# Patient Record
Sex: Male | Born: 1965 | Race: White | Hispanic: No | Marital: Single | Smoking: Current every day smoker
Health system: Southern US, Community
[De-identification: ages and names within clinical notes are randomized; demographics above are authoritative.]

## PROBLEM LIST (undated history)

## (undated) DIAGNOSIS — F419 Anxiety disorder, unspecified: Secondary | ICD-10-CM

## (undated) DIAGNOSIS — D369 Benign neoplasm, unspecified site: Secondary | ICD-10-CM

## (undated) DIAGNOSIS — I10 Essential (primary) hypertension: Secondary | ICD-10-CM

## (undated) DIAGNOSIS — K859 Acute pancreatitis without necrosis or infection, unspecified: Secondary | ICD-10-CM

## (undated) DIAGNOSIS — K635 Polyp of colon: Secondary | ICD-10-CM

## (undated) DIAGNOSIS — E785 Hyperlipidemia, unspecified: Secondary | ICD-10-CM

## (undated) DIAGNOSIS — K5790 Diverticulosis of intestine, part unspecified, without perforation or abscess without bleeding: Secondary | ICD-10-CM

## (undated) HISTORY — PX: KNEE SURGERY: SHX244

## (undated) HISTORY — DX: Diverticulosis of intestine, part unspecified, without perforation or abscess without bleeding: K57.90

## (undated) HISTORY — DX: Anxiety disorder, unspecified: F41.9

## (undated) HISTORY — PX: FEMORAL HERNIA REPAIR: SHX632

## (undated) HISTORY — DX: Benign neoplasm, unspecified site: D36.9

## (undated) HISTORY — DX: Polyp of colon: K63.5

## (undated) HISTORY — DX: Acute pancreatitis without necrosis or infection, unspecified: K85.90

## (undated) HISTORY — DX: Hyperlipidemia, unspecified: E78.5

---

## 2002-06-21 ENCOUNTER — Ambulatory Visit (HOSPITAL_COMMUNITY): Admission: RE | Admit: 2002-06-21 | Discharge: 2002-06-21 | Payer: Self-pay | Admitting: Family Medicine

## 2002-06-21 ENCOUNTER — Encounter: Payer: Self-pay | Admitting: Family Medicine

## 2004-04-21 ENCOUNTER — Ambulatory Visit: Payer: Self-pay | Admitting: Gastroenterology

## 2004-05-01 ENCOUNTER — Ambulatory Visit (HOSPITAL_COMMUNITY): Admission: RE | Admit: 2004-05-01 | Discharge: 2004-05-01 | Payer: Self-pay | Admitting: Gastroenterology

## 2004-05-01 ENCOUNTER — Ambulatory Visit: Payer: Self-pay | Admitting: Gastroenterology

## 2004-05-05 ENCOUNTER — Ambulatory Visit: Payer: Self-pay | Admitting: Gastroenterology

## 2004-05-11 ENCOUNTER — Ambulatory Visit: Payer: Self-pay | Admitting: Gastroenterology

## 2004-05-13 ENCOUNTER — Ambulatory Visit: Payer: Self-pay | Admitting: Gastroenterology

## 2004-05-25 ENCOUNTER — Encounter: Admission: RE | Admit: 2004-05-25 | Discharge: 2004-08-23 | Payer: Self-pay | Admitting: Gastroenterology

## 2004-05-29 ENCOUNTER — Ambulatory Visit: Payer: Self-pay | Admitting: Gastroenterology

## 2004-06-02 ENCOUNTER — Ambulatory Visit: Payer: Self-pay | Admitting: Gastroenterology

## 2004-07-31 ENCOUNTER — Ambulatory Visit: Payer: Self-pay | Admitting: Gastroenterology

## 2004-08-06 ENCOUNTER — Ambulatory Visit: Payer: Self-pay | Admitting: Cardiology

## 2004-09-24 ENCOUNTER — Ambulatory Visit: Payer: Self-pay | Admitting: Internal Medicine

## 2004-11-19 ENCOUNTER — Ambulatory Visit: Payer: Self-pay | Admitting: Cardiology

## 2004-12-24 ENCOUNTER — Ambulatory Visit: Payer: Self-pay | Admitting: Cardiology

## 2005-04-22 ENCOUNTER — Ambulatory Visit: Payer: Self-pay | Admitting: Cardiology

## 2005-11-11 ENCOUNTER — Ambulatory Visit: Payer: Self-pay | Admitting: Cardiology

## 2005-12-22 ENCOUNTER — Ambulatory Visit (HOSPITAL_COMMUNITY): Admission: RE | Admit: 2005-12-22 | Discharge: 2005-12-22 | Payer: Self-pay | Admitting: Orthopaedic Surgery

## 2006-02-03 ENCOUNTER — Ambulatory Visit (HOSPITAL_BASED_OUTPATIENT_CLINIC_OR_DEPARTMENT_OTHER): Admission: RE | Admit: 2006-02-03 | Discharge: 2006-02-03 | Payer: Self-pay | Admitting: Orthopedic Surgery

## 2007-11-26 ENCOUNTER — Emergency Department (HOSPITAL_COMMUNITY): Admission: EM | Admit: 2007-11-26 | Discharge: 2007-11-26 | Payer: Self-pay | Admitting: Emergency Medicine

## 2010-07-17 NOTE — Op Note (Signed)
NAME:  Daniel Mcpherson, Daniel Mcpherson                  ACCOUNT NO.:  [id number]   MEDICAL RECORD NO.:  [id number]          PATIENT TYPE:  AMB   LOCATION:  DSC                          FACILITY:  [facility]   PHYSICIAN:  Rodney A. Mortenson, M.D.DATE OF BIRTH:  June 11, 1965   DATE OF PROCEDURE:  02/03/2006  DATE OF DISCHARGE:                               OPERATIVE REPORT   JUSTIFICATION:  A 45 year old male having trouble with his left knee for  over a year.  No history of injury.  Going up and down stairs causes  pain and discomfort.  He has pain along the medial joint line.  There is  acute tenderness along medial joint line, along the mid third and  posterior third of the medial joint line.  No fluid in the knee.  An MRI  was done and shows an oblique tear of the posterior horn of the medial  meniscus and a small medial meniscal cyst.  No other significant  abnormality is noted.  Because of persistent pain and discomfort, it is  felt that arthroscopic evaluation and treatment was indicated and  necessary.  Questions answered and encouraged extensively  preoperatively.  Complications discussed.   JUSTIFICATION FOR OUTPATIENT SURGERY:  Minimal morbidity.   PREOPERATIVE DIAGNOSIS:  Tear, medial meniscus, left knee.   POSTOPERATIVE DIAGNOSIS:  Tear, medial meniscus, left knee.   OPERATION:  Debride posterior horn of the medial meniscus, left knee.   SURGEON:  Lenard Galloway. Chaney Malling, M.D.   ANESTHESIA:  MAC.   PATHOLOGY:  With the arthroscope in the knee, a very careful examination  of the knee was undertaken.  The patellofemoral joint appeared fairly  normal.  Good articular cartilage.  The arthroscope was placed into the  intercondylar notch.  The anterior cruciate was absolutely normal.  In  the lateral compartment there was normal articular cartilage.  The  lateral femoral condyle, the lateral tibial plateau, and the entire  circumference of the lateral meniscus were normal.  In the medial  compartment  there was good articular cartilage over the distal femur and  proximal tibial plateau with no damage, but there was a complex tear of  the posterior horn of the medial meniscus.   PROCEDURE:  The patient was placed on the operating table in the supine  position with a pneumatic tourniquet about the left upper thigh.  Th  left leg was placed in the leg holder and the entire left lower  extremity prepped with DuraPrep and draped out in the usual manner.  An  infusion cannula was placed in the superomedial pouch and the knee  distended with saline.  Anteromedial and anterolateral portals made and  the arthroscope was introduced.  The only pathology was seen in the  medial compartment.  Through both medial lateral portals a series of  baskets were inserted and the complex tear of the posterior horn was  debrided very aggressively.  This was followed up with the intra-  articular shaver.  All debris was removed.  The remaining rim was then  smoothed and balanced with nice transition to the mid third  of the  medial meniscus.  The rest of the knee appeared absolutely normal.  Marcaine was placed in the knee and a large bulky pressure dressing  applied.  The patient returned to the recovery room in the excellent  condition.  I was a very pleased with surgical outcome.   DISPOSITION:  1. Percocet for pain.  2. To my office on Wednesday of next week.  3. Other home instructions given verbally to the patient.           ______________________________  Lenard Galloway. Chaney Malling, M.D.     RAM/MEDQ  D:  02/03/2006  T:  02/03/2006  Job:  [id number]

## 2012-03-13 ENCOUNTER — Other Ambulatory Visit (HOSPITAL_COMMUNITY): Payer: Self-pay | Admitting: Physical Medicine and Rehabilitation

## 2012-03-13 DIAGNOSIS — M542 Cervicalgia: Secondary | ICD-10-CM

## 2012-03-13 DIAGNOSIS — M5020 Other cervical disc displacement, unspecified cervical region: Secondary | ICD-10-CM

## 2012-03-15 ENCOUNTER — Ambulatory Visit (HOSPITAL_COMMUNITY)
Admission: RE | Admit: 2012-03-15 | Discharge: 2012-03-15 | Disposition: A | Discharge status: Home / Self Care | Payer: 59 | Source: Ambulatory Visit | Attending: Physical Medicine and Rehabilitation | Admitting: Physical Medicine and Rehabilitation

## 2012-03-15 DIAGNOSIS — M542 Cervicalgia: Secondary | ICD-10-CM

## 2012-03-15 DIAGNOSIS — R209 Unspecified disturbances of skin sensation: Secondary | ICD-10-CM | POA: Insufficient documentation

## 2012-03-15 DIAGNOSIS — M5020 Other cervical disc displacement, unspecified cervical region: Secondary | ICD-10-CM | POA: Insufficient documentation

## 2012-11-25 ENCOUNTER — Encounter (HOSPITAL_COMMUNITY): Payer: Self-pay | Admitting: *Deleted

## 2012-11-25 ENCOUNTER — Inpatient Hospital Stay (HOSPITAL_COMMUNITY)
Admission: EM | Admit: 2012-11-25 | Discharge: 2012-11-28 | DRG: 088 | Disposition: A | Discharge status: Home / Self Care | Payer: 59 | Attending: General Surgery | Admitting: General Surgery

## 2012-11-25 ENCOUNTER — Emergency Department (HOSPITAL_COMMUNITY): Payer: 59

## 2012-11-25 DIAGNOSIS — S060X1A Concussion with loss of consciousness of 30 minutes or less, initial encounter: Principal | ICD-10-CM | POA: Diagnosis present

## 2012-11-25 DIAGNOSIS — W1789XA Other fall from one level to another, initial encounter: Secondary | ICD-10-CM | POA: Diagnosis present

## 2012-11-25 DIAGNOSIS — Y93H2 Activity, gardening and landscaping: Secondary | ICD-10-CM

## 2012-11-25 DIAGNOSIS — S42109A Fracture of unspecified part of scapula, unspecified shoulder, initial encounter for closed fracture: Secondary | ICD-10-CM

## 2012-11-25 DIAGNOSIS — S22009A Unspecified fracture of unspecified thoracic vertebra, initial encounter for closed fracture: Secondary | ICD-10-CM

## 2012-11-25 DIAGNOSIS — I10 Essential (primary) hypertension: Secondary | ICD-10-CM | POA: Diagnosis present

## 2012-11-25 DIAGNOSIS — S2242XA Multiple fractures of ribs, left side, initial encounter for closed fracture: Secondary | ICD-10-CM

## 2012-11-25 DIAGNOSIS — W11XXXA Fall on and from ladder, initial encounter: Secondary | ICD-10-CM | POA: Diagnosis present

## 2012-11-25 DIAGNOSIS — S2249XA Multiple fractures of ribs, unspecified side, initial encounter for closed fracture: Secondary | ICD-10-CM | POA: Diagnosis present

## 2012-11-25 DIAGNOSIS — W19XXXA Unspecified fall, initial encounter: Secondary | ICD-10-CM

## 2012-11-25 DIAGNOSIS — S270XXA Traumatic pneumothorax, initial encounter: Secondary | ICD-10-CM

## 2012-11-25 DIAGNOSIS — S42102A Fracture of unspecified part of scapula, left shoulder, initial encounter for closed fracture: Secondary | ICD-10-CM

## 2012-11-25 DIAGNOSIS — F3289 Other specified depressive episodes: Secondary | ICD-10-CM | POA: Diagnosis present

## 2012-11-25 DIAGNOSIS — F17200 Nicotine dependence, unspecified, uncomplicated: Secondary | ICD-10-CM | POA: Diagnosis present

## 2012-11-25 DIAGNOSIS — S060X9A Concussion with loss of consciousness of unspecified duration, initial encounter: Secondary | ICD-10-CM

## 2012-11-25 DIAGNOSIS — S42143A Displaced fracture of glenoid cavity of scapula, unspecified shoulder, initial encounter for closed fracture: Secondary | ICD-10-CM | POA: Diagnosis present

## 2012-11-25 DIAGNOSIS — Z7982 Long term (current) use of aspirin: Secondary | ICD-10-CM

## 2012-11-25 DIAGNOSIS — F329 Major depressive disorder, single episode, unspecified: Secondary | ICD-10-CM | POA: Diagnosis present

## 2012-11-25 DIAGNOSIS — S271XXA Traumatic hemothorax, initial encounter: Secondary | ICD-10-CM | POA: Diagnosis present

## 2012-11-25 HISTORY — DX: Essential (primary) hypertension: I10

## 2012-11-25 LAB — POCT I-STAT, CHEM 8
Calcium, Ion: 1.13 mmol/L (ref 1.12–1.23)
Glucose, Bld: 131 mg/dL — ABNORMAL HIGH (ref 70–99)
HCT: 53 % — ABNORMAL HIGH (ref 39.0–52.0)
Hemoglobin: 18 g/dL — ABNORMAL HIGH (ref 13.0–17.0)

## 2012-11-25 LAB — COMPREHENSIVE METABOLIC PANEL
Albumin: 4.2 g/dL (ref 3.5–5.2)
BUN: 22 mg/dL (ref 6–23)
Calcium: 9.4 mg/dL (ref 8.4–10.5)
Chloride: 98 mEq/L (ref 96–112)
Creatinine, Ser: 0.95 mg/dL (ref 0.50–1.35)
Total Bilirubin: 0.4 mg/dL (ref 0.3–1.2)

## 2012-11-25 LAB — CBC
Hemoglobin: 17.6 g/dL — ABNORMAL HIGH (ref 13.0–17.0)
MCV: 88.1 fL (ref 78.0–100.0)
Platelets: 206 10*3/uL (ref 150–400)
RBC: 5.55 MIL/uL (ref 4.22–5.81)
WBC: 23.5 10*3/uL — ABNORMAL HIGH (ref 4.0–10.5)

## 2012-11-25 LAB — SAMPLE TO BLOOD BANK

## 2012-11-25 LAB — PROTIME-INR: Prothrombin Time: 11.8 seconds (ref 11.6–15.2)

## 2012-11-25 MED ORDER — PANTOPRAZOLE SODIUM 40 MG PO TBEC: 40.0000 mg | DELAYED_RELEASE_TABLET | Freq: Every day | ORAL | Status: DC

## 2012-11-25 MED ORDER — MORPHINE SULFATE 4 MG/ML IJ SOLN
4.0000 mg | INTRAMUSCULAR | Status: DC | PRN
Start: 2012-11-25 — End: 2012-11-26
  Administered 2012-11-26 (×2): 4 mg via INTRAVENOUS
  Filled 2012-11-25 (×3): qty 1

## 2012-11-25 MED ORDER — HYDROMORPHONE HCL PF 1 MG/ML IJ SOLN
1.0000 mg | Freq: Once | INTRAMUSCULAR | Status: AC
  Administered 2012-11-25: 1 mg via INTRAVENOUS
  Filled 2012-11-25: qty 1

## 2012-11-25 MED ORDER — ONDANSETRON HCL 4 MG/2ML IJ SOLN: 4.0000 mg | Freq: Four times a day (QID) | INTRAMUSCULAR | Status: DC | PRN

## 2012-11-25 MED ORDER — MORPHINE SULFATE 4 MG/ML IJ SOLN
INTRAMUSCULAR | Status: AC
  Administered 2012-11-25: 4 mg
  Filled 2012-11-25: qty 1

## 2012-11-25 MED ORDER — SODIUM CHLORIDE 0.9 % IV BOLUS (SEPSIS)
500.0000 mL | Freq: Once | INTRAVENOUS | Status: AC
  Administered 2012-11-25: 500 mL via INTRAVENOUS

## 2012-11-25 MED ORDER — KCL IN DEXTROSE-NACL 20-5-0.9 MEQ/L-%-% IV SOLN
INTRAVENOUS | Status: DC
  Administered 2012-11-25: 100 mL/h via INTRAVENOUS
  Administered 2012-11-26 – 2012-11-27 (×2): via INTRAVENOUS
  Filled 2012-11-25 (×5): qty 1000

## 2012-11-25 MED ORDER — ONDANSETRON HCL 4 MG PO TABS: 4.0000 mg | ORAL_TABLET | Freq: Four times a day (QID) | ORAL | Status: DC | PRN

## 2012-11-25 MED ORDER — PANTOPRAZOLE SODIUM 40 MG IV SOLR
40.0000 mg | Freq: Every day | INTRAVENOUS | Status: DC
  Filled 2012-11-25: qty 40

## 2012-11-25 MED ORDER — HYDROCHLOROTHIAZIDE 25 MG PO TABS
25.0000 mg | ORAL_TABLET | Freq: Every day | ORAL | Status: DC
  Administered 2012-11-26 – 2012-11-28 (×3): 25 mg via ORAL
  Filled 2012-11-25 (×4): qty 1

## 2012-11-25 MED ORDER — ESCITALOPRAM OXALATE 10 MG PO TABS
10.0000 mg | ORAL_TABLET | Freq: Every day | ORAL | Status: DC
  Administered 2012-11-26 – 2012-11-28 (×3): 10 mg via ORAL
  Filled 2012-11-25 (×4): qty 1

## 2012-11-25 MED ORDER — IOHEXOL 300 MG/ML  SOLN
80.0000 mL | Freq: Once | INTRAMUSCULAR | Status: AC | PRN
  Administered 2012-11-25: 80 mL via INTRAVENOUS

## 2012-11-25 NOTE — ED Notes (Signed)
Former girlfriend  At the bedside.  The pt reports that he does not remember  The accident.  He has a bulging disc in his neck

## 2012-11-25 NOTE — ED Notes (Signed)
Pt to ct 

## 2012-11-25 NOTE — ED Notes (Signed)
The pt  Returned from xray.  Father still at the bedside

## 2012-11-25 NOTE — ED Notes (Signed)
In by [facility] fall from a tree cutting limbc   approx 15 feet.  ?? Loc  Alert on arrival.  Painful  Lt shoulder

## 2012-11-25 NOTE — ED Notes (Signed)
The pt reports that his pain in his lt shoulder is worse since the  c-t

## 2012-11-25 NOTE — ED Notes (Signed)
Report called to 3100 rn 

## 2012-11-25 NOTE — ED Notes (Signed)
Iv pain med given for his lt shoulder and chest pain

## 2012-11-25 NOTE — ED Notes (Signed)
Dr toth here to see the pt

## 2012-11-25 NOTE — ED Notes (Signed)
Dr Radford Pax has  Agreed to let him have ice ships

## 2012-11-25 NOTE — Progress Notes (Signed)
Orthopedic Tech Progress Note Patient Details:  Daniel Mcpherson 1965-11-24 [id number]  Patient ID: Daniel Mcpherson, male   DOB: 1965-10-20, 47 y.o.   MRN: [id number] Made level 2 trauma visit  Daniel Mcpherson 11/25/2012, 5:55 PM

## 2012-11-25 NOTE — ED Notes (Signed)
Xray back for a portable shoulder.  His pain was better.  His father is here with his sister

## 2012-11-25 NOTE — ED Notes (Signed)
No fluids hanging on arrival from ems

## 2012-11-25 NOTE — ED Notes (Signed)
Pain med given .  The pt keeps requesting to turn  Over he is very uncomfortable

## 2012-11-25 NOTE — ED Provider Notes (Signed)
CSN: [id number]     Arrival date & time 11/25/12  1743 History   First MD Initiated Contact with Patient 11/25/12 1751     Chief Complaint  Patient presents with  . fall level 2    (Consider location/radiation/quality/duration/timing/severity/associated sxs/prior Treatment) HPI Comments: 47 y/o male presenting as level 2 trauma after 18 ft fall from tree while cutting limbs. + LOC. Alert on arrival of EMS, GCS 13. Hemodynamically stable. Reports left shoulder and chest pain. Mild SOB but not hypoxic.   The history is provided by the patient and the EMS personnel. No language interpreter was used.    Past Medical History  Diagnosis Date  . Hypertension    History reviewed. No pertinent past surgical history. No family history on file. History  Substance Use Topics  . Smoking status: Current Every Day Smoker  . Smokeless tobacco: Not on file  . Alcohol Use: Yes    Review of Systems  Respiratory: Positive for chest tightness and shortness of breath.   Cardiovascular: Positive for chest pain. Negative for palpitations.  Gastrointestinal: Negative for abdominal pain and abdominal distention.  Genitourinary: Negative for flank pain.  Musculoskeletal: Positive for back pain. Negative for joint swelling.  Skin: Positive for wound.  Neurological: Negative for headaches.  All other systems reviewed and are negative.    Allergies  Review of patient's allergies indicates no known allergies.  Home Medications   Current Outpatient Rx  Name  Route  Sig  Dispense  Refill  . aspirin EC 81 MG tablet   Oral   Take 81 mg by mouth daily.         Marland Kitchen escitalopram (LEXAPRO) 10 MG tablet   Oral   Take 10 mg by mouth daily.         . hydrochlorothiazide (HYDRODIURIL) 25 MG tablet   Oral   Take 25 mg by mouth daily.         . Omega-3 Fatty Acids (OMEGA 3 PO)   Oral   Take 3,000 mg by mouth daily.          BP 120/90  Pulse 102  Temp(Src) 99.3 F (37.4 C)  Resp 20  Ht 5'  6" (1.676 m)  Wt 160 lb (72.576 kg)  BMI 25.84 kg/m2  SpO2 96% Physical Exam  Vitals reviewed. Constitutional: He is oriented to person, place, and time. He appears well-developed and well-nourished. No distress.  HENT:  Mouth/Throat: Oropharynx is clear and moist.  Abrasion to left parietal region, hemostatic  Eyes: Pupils are equal, round, and reactive to light.  Neck: No tracheal deviation present.  Cervical collar  Cardiovascular: Regular rhythm, normal heart sounds and intact distal pulses.  Tachycardia present.   Pulmonary/Chest: Effort normal and breath sounds normal. He exhibits tenderness (left, no crepitus).  Abdominal: Soft. Bowel sounds are normal. He exhibits no distension. There is no tenderness.  Musculoskeletal:       Cervical back: He exhibits no bony tenderness and no deformity.       Thoracic back: He exhibits no bony tenderness and no deformity.       Lumbar back: He exhibits no bony tenderness and no deformity.  Moving all extremities, no obvious bony deformity. Pelvis stable to compression  Neurological: He is alert and oriented to person, place, and time.  Skin: Skin is warm and dry.       ED Course  Procedures (including critical care time) Labs Review Labs Reviewed  COMPREHENSIVE METABOLIC PANEL - Abnormal; Notable  for the following:    Potassium 3.4 (*)    Glucose, Bld 127 (*)    All other components within normal limits  CBC - Abnormal; Notable for the following:    WBC 23.5 (*)    Hemoglobin 17.6 (*)    All other components within normal limits  CG4 I-STAT (LACTIC ACID) - Abnormal; Notable for the following:    Lactic Acid, Venous 2.22 (*)    All other components within normal limits  POCT I-STAT, CHEM 8 - Abnormal; Notable for the following:    Potassium 3.3 (*)    BUN 25 (*)    Glucose, Bld 131 (*)    Hemoglobin 18.0 (*)    HCT 53.0 (*)    All other components within normal limits  CDS SEROLOGY  PROTIME-INR  SAMPLE TO BLOOD BANK    Imaging Review Dg Pelvis Portable  11/25/2012   CLINICAL DATA:  Prominent, fell 15 feet from a tree  EXAM: PORTABLE PELVIS  COMPARISON:  Portable exam 1750 hr without priors for comparison.  FINDINGS: Symmetric hip and SI joints.  Osseous mineralization normal.  No acute fracture, dislocation or bone destruction.  IMPRESSION: No acute abnormalities.   Electronically Signed   By: Ulyses Southward M.D.   On: 11/25/2012 18:09   Dg Chest Port 1 View  11/25/2012   CLINICAL DATA:  Trauma, fell 18 feet from a tree  EXAM: PORTABLE CHEST - 1 VIEW  COMPARISON:  Portable exam 1749 hr without priors for comparison.  FINDINGS: Normal heart size, mediastinal contours, and pulmonary vascularity.  Lungs clear.  No pleural effusion or pneumothorax.  Minimally displaced fractures are identified at the lateral aspects of the left 3rd 4th 5th and 6th ribs. Question additional nondisplaced fracture of left 7th rib. Left shoulder joint is inadequately visualized at the margin of the film, partially obscured by text, with poor visualization of the left glenoid noted.  IMPRESSION: Fractures of left 3rd, 4th, 5th, 6th and questionably 7th ribs. Left glenoid is poorly visualized, partially obscured by text ; if there is clinical concern for left shoulder injury, recommend dedicated radiographs.   Electronically Signed   By: Ulyses Southward M.D.   On: 11/25/2012 18:08    Date: 11/25/2012  Rate: 100  Rhythm: sinus tachycardia  QRS Axis: normal  Intervals: normal  ST/T Wave abnormalities: normal  Conduction Disutrbances:right bundle branch block  Narrative Interpretation: sinus tachycardia, no ischemic changes  Old EKG Reviewed: none available   MDM  No diagnosis found.  47 y/o male Level 2 trauma after fall 18 ft from tree. + LOC. Hemodynamically stable on arrival. ABCs intact. Reporting pain over left scapula with mild SOB. CXR showed left rib fractures. Patient hemodynamically stable and appropriate for CT. Injuries  identified on CT: L 3-9 rib fractures, small <10% pneumothorax, L T1-T6 transverse process fractures. Trauma consulted for admission. Orthopedics consulted with recommendation for sling.   Labs and imaging reviewed in my medical decision making. Patient discussed with my attending, Dr. Radford Pax.     Abagail Kitchens, MD 11/26/12 1227

## 2012-11-25 NOTE — ED Notes (Signed)
The pt arrived on lsb c-collar.  He fell approx 15 feet from a tree while cutting tree limbs.  ?? Loc lac to the back of his head. Painful lt shoulder abrasion to the rt wrist.  C/o lt chest pain.  Bi-lateral breath sounds.  Iv per [facility].  Pt alert not remembering the accident.  Pupils  Equal and react.

## 2012-11-25 NOTE — ED Notes (Signed)
The family remains ar the bedside.  According to the pts father dr toth will not let the pt have any more ice

## 2012-11-25 NOTE — ED Notes (Signed)
The pt has been cool intermittently warm blankets placed

## 2012-11-25 NOTE — H&P (Signed)
Daniel Mcpherson is an 47 y.o. male.   Chief Complaint: left shoulder pain HPI: The pt is a 47yo wm who was on a ladder sawing a limb with a chain saw. The limb swung back at him and knocked him off the ladder. He fell about 42ft onto his left side. He did lose consciousness. No hypotension. He complains of left shoulder pain  Past Medical History  Diagnosis Date  . Hypertension     History reviewed. No pertinent past surgical history.  No family history on file. Social History:  reports that he has been smoking.  He does not have any smokeless tobacco history on file. He reports that  drinks alcohol. His drug history is not on file.  Allergies: No Known Allergies   (Not in a hospital admission)  Results for orders placed during the hospital encounter of 11/25/12 (from the past 48 hour(s))  CDS SEROLOGY     Status: None   Collection Time    11/25/12  6:00 PM      Result Value Range   CDS serology specimen       Value: SPECIMEN WILL BE HELD FOR 14 DAYS IF TESTING IS REQUIRED  COMPREHENSIVE METABOLIC PANEL     Status: Abnormal   Collection Time    11/25/12  6:00 PM      Result Value Range   Sodium 136  135 - 145 mEq/L   Potassium 3.4 (*) 3.5 - 5.1 mEq/L   Chloride 98  96 - 112 mEq/L   CO2 23  19 - 32 mEq/L   Glucose, Bld 127 (*) 70 - 99 mg/dL   BUN 22  6 - 23 mg/dL   Creatinine, Ser 1.61  0.50 - 1.35 mg/dL   Calcium 9.4  8.4 - 09.6 mg/dL   Total Protein 7.4  6.0 - 8.3 g/dL   Albumin 4.2  3.5 - 5.2 g/dL   AST 36  0 - 37 U/L   ALT 24  0 - 53 U/L   Alkaline Phosphatase 102  39 - 117 U/L   Total Bilirubin 0.4  0.3 - 1.2 mg/dL   GFR calc non Af Amer >90  >90 mL/min   GFR calc Af Amer >90  >90 mL/min   Comment: (NOTE)     The eGFR has been calculated using the CKD EPI equation.     This calculation has not been validated in all clinical situations.     eGFR's persistently <90 mL/min signify possible Chronic Kidney     Disease.  CBC     Status: Abnormal   Collection Time     11/25/12  6:00 PM      Result Value Range   WBC 23.5 (*) 4.0 - 10.5 K/uL   RBC 5.55  4.22 - 5.81 MIL/uL   Hemoglobin 17.6 (*) 13.0 - 17.0 g/dL   HCT 04.5  40.9 - 81.1 %   MCV 88.1  78.0 - 100.0 fL   MCH 31.7  26.0 - 34.0 pg   MCHC 36.0  30.0 - 36.0 g/dL   RDW 91.4  78.2 - 95.6 %   Platelets 206  150 - 400 K/uL  PROTIME-INR     Status: None   Collection Time    11/25/12  6:00 PM      Result Value Range   Prothrombin Time 11.8  11.6 - 15.2 seconds   INR 0.88  0.00 - 1.49  SAMPLE TO BLOOD BANK     Status: None  Collection Time    11/25/12  6:00 PM      Result Value Range   Blood Bank Specimen SAMPLE AVAILABLE FOR TESTING     Sample Expiration 11/26/2012    POCT I-STAT, CHEM 8     Status: Abnormal   Collection Time    11/25/12  6:11 PM      Result Value Range   Sodium 141  135 - 145 mEq/L   Potassium 3.3 (*) 3.5 - 5.1 mEq/L   Chloride 105  96 - 112 mEq/L   BUN 25 (*) 6 - 23 mg/dL   Creatinine, Ser 4.54  0.50 - 1.35 mg/dL   Glucose, Bld 098 (*) 70 - 99 mg/dL   Calcium, Ion 1.19  1.47 - 1.23 mmol/L   TCO2 25  0 - 100 mmol/L   Hemoglobin 18.0 (*) 13.0 - 17.0 g/dL   HCT 82.9 (*) 56.2 - 13.0 %  CG4 I-STAT (LACTIC ACID)     Status: Abnormal   Collection Time    11/25/12  6:12 PM      Result Value Range   Lactic Acid, Venous 2.22 (*) 0.5 - 2.2 mmol/L   Ct Head Wo Contrast  11/25/2012   *RADIOLOGY REPORT*  Clinical Data:  47 year old male with head and neck pain following a 15 foot fall.  Loss of consciousness.  CT HEAD WITHOUT CONTRAST CT CERVICAL SPINE WITHOUT CONTRAST  Technique:  Multidetector CT imaging of the head and cervical spine was performed following the standard protocol without intravenous contrast.  Multiplanar CT image reconstructions of the cervical spine were also generated.  Comparison:  03/15/2012 cervical spine MR  CT HEAD  Findings: No intracranial abnormalities are identified, including mass lesion or mass effect, hydrocephalus, extra-axial fluid collection,  midline shift, hemorrhage, or acute infarction.  The visualized bony calvarium is unremarkable. A mucous retention cyst/polyp within the left maxillary sinus is identified.  IMPRESSION: No evidence of acute intracranial abnormality.  CT CERVICAL SPINE  Findings: Normal alignment is noted. There is no evidence of cervical spine fracture, subluxation or prevertebral soft tissue swelling. Mild to moderate degenerative disc disease/spondylosis is present from C5-C7. Fractures of the left transverse processes of T1 and T2 are noted as well as fractures of the posterior medial left 1st, 2nd and 3rd ribs. There is a minuscule left apical pneumothorax present.  IMPRESSION: Fractures of left T1 and T2 transverse processes and left 1st, 2nd and 3rd ribs.  Minuscule left apical pneumothorax.  No static evidence of acute injury to the cervical spine.   Original Report Authenticated By: Harmon Pier, M.D.   Ct Chest W Contrast  11/25/2012   CLINICAL DATA:  Larey Seat 15 feet from a tree, left shoulder pain  EXAM: CT CHEST, ABDOMEN, AND PELVIS WITH CONTRAST  TECHNIQUE: Multidetector CT imaging of the chest, abdomen and pelvis was performed following the standard protocol during bolus administration of intravenous contrast. Sagittal and coronal MPR images reconstructed from axial data set.  CONTRAST:  80mL OMNIPAQUE IOHEXOL 300 MG/ML SOLN No oral contrast administered.  COMPARISON:  CT abdomen and pelvis 05/01/2004  FINDINGS: CT CHEST FINDINGS  Vascular structures patent on nondedicated exam.  No anterior mediastinal hematoma.  Small posterior mediastinal hematoma is identified anterior and left lateral to the spine.  Dependent atelectasis in bilateral lower lobes greater on left.  Small pulmonary contusion laterally in the left mid lung.  Small anterior left pneumothorax without definite pleural effusion.  Nondisplaced transverse process fractures left T1 through T6.  Minimally  displaced fractures of the lateral/ posterior lateral  left 3rd through 9th ribs. Comminuted left scapular fracture extending into the glenohumeral joint with displaced articular fragments.  No right-side rib fractures identified.  Vertebral body heights maintained.  CT ABDOMEN AND PELVIS FINDINGS  Low-attenuation mass with nodular peripheral enhancement within right lobe of liver, 4.0 x 4.0 x 2.8 cm, fill-in on delayed images, most consistent with a hepatic hemangioma, unchanged from previous exam.  Liver, spleen, pancreas, kidneys, and adrenal glands normal appearance.  Diverticulosis of descending and sigmoid colon.  Normal appendix.  Stomach and bowel loops otherwise normal appearance.  Mild prostatic enlargement with unremarkable bladder.  Prominent internal inguinal rings bilaterally.  No mass, adenopathy, free fluid, or free air.  No additional fractures.  IMPRESSION: CT CHEST IMPRESSION  Small pulmonary contusion lateral left mid lung.  Bibasilar atelectasis.  Fractures of the left lateral/ posterolateral 3rd through 9th ribs.  Nondisplaced fractures left transverse processes of T1 through T6.  Comminuted displaced left scapular fracture with intra-articular extension at glenohumeral joint.  Small left pneumothorax.  CT ABDOMEN AND PELVIS IMPRESSION  No acute intra-abdominal or intrapelvic abnormalities.  Large hemangioma right lobe liver 4.0 x 4.0 x 2.8 cm.  Descending and sigmoid colonic diverticulosis.  CriticalValue/emergent results were called by telephone at the time of interpretation on 11/25/2012 at [facility] , who verbally acknowledged these results.   Electronically Signed   By: Ulyses Southward M.D.   On: 11/25/2012 20:00   Ct Cervical Spine Wo Contrast  11/25/2012   *RADIOLOGY REPORT*  Clinical Data:  47 year old male with head and neck pain following a 15 foot fall.  Loss of consciousness.  CT HEAD WITHOUT CONTRAST CT CERVICAL SPINE WITHOUT CONTRAST  Technique:  Multidetector CT imaging of the head and cervical spine was performed  following the standard protocol without intravenous contrast.  Multiplanar CT image reconstructions of the cervical spine were also generated.  Comparison:  03/15/2012 cervical spine MR  CT HEAD  Findings: No intracranial abnormalities are identified, including mass lesion or mass effect, hydrocephalus, extra-axial fluid collection, midline shift, hemorrhage, or acute infarction.  The visualized bony calvarium is unremarkable. A mucous retention cyst/polyp within the left maxillary sinus is identified.  IMPRESSION: No evidence of acute intracranial abnormality.  CT CERVICAL SPINE  Findings: Normal alignment is noted. There is no evidence of cervical spine fracture, subluxation or prevertebral soft tissue swelling. Mild to moderate degenerative disc disease/spondylosis is present from C5-C7. Fractures of the left transverse processes of T1 and T2 are noted as well as fractures of the posterior medial left 1st, 2nd and 3rd ribs. There is a minuscule left apical pneumothorax present.  IMPRESSION: Fractures of left T1 and T2 transverse processes and left 1st, 2nd and 3rd ribs.  Minuscule left apical pneumothorax.  No static evidence of acute injury to the cervical spine.   Original Report Authenticated By: Harmon Pier, M.D.   Ct Abdomen Pelvis W Contrast  11/25/2012   CLINICAL DATA:  Larey Seat 15 feet from a tree, left shoulder pain  EXAM: CT CHEST, ABDOMEN, AND PELVIS WITH CONTRAST  TECHNIQUE: Multidetector CT imaging of the chest, abdomen and pelvis was performed following the standard protocol during bolus administration of intravenous contrast. Sagittal and coronal MPR images reconstructed from axial data set.  CONTRAST:  80mL OMNIPAQUE IOHEXOL 300 MG/ML SOLN No oral contrast administered.  COMPARISON:  CT abdomen and pelvis 05/01/2004  FINDINGS: CT CHEST FINDINGS  Vascular structures patent on nondedicated exam.  No  anterior mediastinal hematoma.  Small posterior mediastinal hematoma is identified anterior and left  lateral to the spine.  Dependent atelectasis in bilateral lower lobes greater on left.  Small pulmonary contusion laterally in the left mid lung.  Small anterior left pneumothorax without definite pleural effusion.  Nondisplaced transverse process fractures left T1 through T6.  Minimally displaced fractures of the lateral/ posterior lateral left 3rd through 9th ribs. Comminuted left scapular fracture extending into the glenohumeral joint with displaced articular fragments.  No right-side rib fractures identified.  Vertebral body heights maintained.  CT ABDOMEN AND PELVIS FINDINGS  Low-attenuation mass with nodular peripheral enhancement within right lobe of liver, 4.0 x 4.0 x 2.8 cm, fill-in on delayed images, most consistent with a hepatic hemangioma, unchanged from previous exam.  Liver, spleen, pancreas, kidneys, and adrenal glands normal appearance.  Diverticulosis of descending and sigmoid colon.  Normal appendix.  Stomach and bowel loops otherwise normal appearance.  Mild prostatic enlargement with unremarkable bladder.  Prominent internal inguinal rings bilaterally.  No mass, adenopathy, free fluid, or free air.  No additional fractures.  IMPRESSION: CT CHEST IMPRESSION  Small pulmonary contusion lateral left mid lung.  Bibasilar atelectasis.  Fractures of the left lateral/ posterolateral 3rd through 9th ribs.  Nondisplaced fractures left transverse processes of T1 through T6.  Comminuted displaced left scapular fracture with intra-articular extension at glenohumeral joint.  Small left pneumothorax.  CT ABDOMEN AND PELVIS IMPRESSION  No acute intra-abdominal or intrapelvic abnormalities.  Large hemangioma right lobe liver 4.0 x 4.0 x 2.8 cm.  Descending and sigmoid colonic diverticulosis.  CriticalValue/emergent results were called by telephone at the time of interpretation on 11/25/2012 at [facility] , who verbally acknowledged these results.   Electronically Signed   By: Ulyses Southward M.D.   On:  11/25/2012 20:00   Dg Pelvis Portable  11/25/2012   CLINICAL DATA:  Prominent, fell 15 feet from a tree  EXAM: PORTABLE PELVIS  COMPARISON:  Portable exam 1750 hr without priors for comparison.  FINDINGS: Symmetric hip and SI joints.  Osseous mineralization normal.  No acute fracture, dislocation or bone destruction.  IMPRESSION: No acute abnormalities.   Electronically Signed   By: Ulyses Southward M.D.   On: 11/25/2012 18:09   Dg Chest Port 1 View  11/25/2012   CLINICAL DATA:  Trauma, fell 18 feet from a tree  EXAM: PORTABLE CHEST - 1 VIEW  COMPARISON:  Portable exam 1749 hr without priors for comparison.  FINDINGS: Normal heart size, mediastinal contours, and pulmonary vascularity.  Lungs clear.  No pleural effusion or pneumothorax.  Minimally displaced fractures are identified at the lateral aspects of the left 3rd 4th 5th and 6th ribs. Question additional nondisplaced fracture of left 7th rib. Left shoulder joint is inadequately visualized at the margin of the film, partially obscured by text, with poor visualization of the left glenoid noted.  IMPRESSION: Fractures of left 3rd, 4th, 5th, 6th and questionably 7th ribs. Left glenoid is poorly visualized, partially obscured by text ; if there is clinical concern for left shoulder injury, recommend dedicated radiographs.   Electronically Signed   By: Ulyses Southward M.D.   On: 11/25/2012 18:08   Dg Shoulder Left Port  11/25/2012   CLINICAL DATA:  Initial encounter for left scapular injury. Patient fell from a tree.  EXAM: PORTABLE LEFT SHOULDER - 2+ VIEW  COMPARISON:  None.  FINDINGS: Severely comminuted fracture involving the scapula. The fracture line may involve the superior aspect of the  glenoid. The glenohumeral joint appears anatomically aligned. There may be slight acromioclavicular separation. No fractures are identified involving the clavicle or the proximal humerus.  IMPRESSION: Severely comminuted fracture involving the left scapula. Possible mild AC  separation.   Electronically Signed   By: Hulan Saas   On: 11/25/2012 18:56    Review of Systems  Constitutional: Negative.   HENT: Negative.   Eyes: Negative.   Respiratory: Negative.   Cardiovascular: Negative.   Gastrointestinal: Negative.   Genitourinary: Negative.   Musculoskeletal: Positive for back pain.  Skin: Negative.   Neurological: Negative.   Endo/Heme/Allergies: Negative.   Psychiatric/Behavioral: Positive for memory loss.    Blood pressure 139/99, pulse 110, temperature 99.6 F (37.6 C), temperature source Axillary, resp. rate 18, height 5\' 6"  (1.676 m), weight 160 lb (72.576 kg), SpO2 98.00%. Physical Exam  Constitutional: He is oriented to person, place, and time. He appears well-developed and well-nourished.  HENT:  Head: Normocephalic and atraumatic.  Eyes: Conjunctivae and EOM are normal. Pupils are equal, round, and reactive to light.  Neck: Normal range of motion. Neck supple.  There is mild tenderness low posteriorly on c spine  Cardiovascular: Normal rate, regular rhythm and normal heart sounds.   Respiratory: Effort normal and breath sounds normal.  GI: Soft. Bowel sounds are normal. There is no tenderness.  Musculoskeletal: Normal range of motion.  Swelling of left shoulder  Neurological: He is alert and oriented to person, place, and time.  Skin: Skin is warm and dry.  Psychiatric: He has a normal mood and affect. His behavior is normal.     Assessment/Plan The pt fell off a ladder about 35ft 1) Very small pneumothorax on left 2) left rib fxs 1-9 3) Transverse process fx T1-6 4) Comminuted left scapula fx Will admit to trauma ICU Consult ortho  TOTH III,PAUL S 11/25/2012, 9:16 PM

## 2012-11-25 NOTE — ED Notes (Signed)
Dr Radford Pax at the bedside telling the pt that he has 4 fractured rib s on the lt

## 2012-11-25 NOTE — ED Notes (Signed)
The pt remains alert c/o rt shoulder and chest pain 7/10 pain

## 2012-11-25 NOTE — Progress Notes (Signed)
Chaplain responded to page from ED concerning a level-2 trauma. The patient had fallen from a tree while trying to trim branches. Chaplain spoke with the patient's girlfriend in consult A and got permission for her to visit the patient. They did not seem to have further needs at this time.

## 2012-11-25 NOTE — ED Notes (Signed)
Portable xrays of chest and pelvis done in the treatment room.

## 2012-11-26 ENCOUNTER — Inpatient Hospital Stay (HOSPITAL_COMMUNITY): Payer: 59

## 2012-11-26 DIAGNOSIS — W19XXXA Unspecified fall, initial encounter: Secondary | ICD-10-CM

## 2012-11-26 DIAGNOSIS — S42102A Fracture of unspecified part of scapula, left shoulder, initial encounter for closed fracture: Secondary | ICD-10-CM

## 2012-11-26 DIAGNOSIS — S22009A Unspecified fracture of unspecified thoracic vertebra, initial encounter for closed fracture: Secondary | ICD-10-CM

## 2012-11-26 DIAGNOSIS — F329 Major depressive disorder, single episode, unspecified: Secondary | ICD-10-CM | POA: Insufficient documentation

## 2012-11-26 DIAGNOSIS — I10 Essential (primary) hypertension: Secondary | ICD-10-CM | POA: Insufficient documentation

## 2012-11-26 DIAGNOSIS — F32A Depression, unspecified: Secondary | ICD-10-CM | POA: Insufficient documentation

## 2012-11-26 DIAGNOSIS — S060X9A Concussion with loss of consciousness of unspecified duration, initial encounter: Secondary | ICD-10-CM

## 2012-11-26 DIAGNOSIS — S2242XA Multiple fractures of ribs, left side, initial encounter for closed fracture: Secondary | ICD-10-CM

## 2012-11-26 LAB — BASIC METABOLIC PANEL
CO2: 25 mEq/L (ref 19–32)
Calcium: 8.6 mg/dL (ref 8.4–10.5)
Creatinine, Ser: 0.78 mg/dL (ref 0.50–1.35)

## 2012-11-26 LAB — MRSA PCR SCREENING: MRSA by PCR: NEGATIVE

## 2012-11-26 LAB — CBC
MCH: 31.6 pg (ref 26.0–34.0)
MCV: 89.3 fL (ref 78.0–100.0)
Platelets: 147 10*3/uL — ABNORMAL LOW (ref 150–400)
RBC: 4.78 MIL/uL (ref 4.22–5.81)
RDW: 13.7 % (ref 11.5–15.5)

## 2012-11-26 MED ORDER — PNEUMOCOCCAL VAC POLYVALENT 25 MCG/0.5ML IJ INJ
0.5000 mL | INJECTION | INTRAMUSCULAR | Status: DC
  Filled 2012-11-26: qty 0.5

## 2012-11-26 MED ORDER — ENOXAPARIN SODIUM 40 MG/0.4ML ~~LOC~~ SOLN
40.0000 mg | SUBCUTANEOUS | Status: DC
  Administered 2012-11-26 – 2012-11-28 (×3): 40 mg via SUBCUTANEOUS
  Filled 2012-11-26 (×5): qty 0.4

## 2012-11-26 MED ORDER — MORPHINE SULFATE 2 MG/ML IJ SOLN
2.0000 mg | INTRAMUSCULAR | Status: DC | PRN
  Administered 2012-11-26: 2 mg via INTRAVENOUS

## 2012-11-26 MED ORDER — OXYCODONE HCL 5 MG PO TABS
5.0000 mg | ORAL_TABLET | ORAL | Status: DC | PRN
  Administered 2012-11-26 – 2012-11-28 (×10): 10 mg via ORAL
  Filled 2012-11-26 (×10): qty 2

## 2012-11-26 MED ORDER — INFLUENZA VAC SPLIT QUAD 0.5 ML IM SUSP
0.5000 mL | INTRAMUSCULAR | Status: DC
  Filled 2012-11-26: qty 0.5

## 2012-11-26 NOTE — Progress Notes (Signed)
Patient ID: Daniel Mcpherson, male   DOB: 1965-09-23, 47 y.o.   MRN: [id number]   LOS: 1 day   Subjective: No unexpected c/o.   Objective: Vital signs in last 24 hours: Temp:  [98.9 F (37.2 C)-99.6 F (37.6 C)] 99 F (37.2 C) (09/28 0300) Pulse Rate:  [91-110] 97 (09/28 0700) Resp:  [12-22] 12 (09/28 0700) BP: (120-139)/(80-120) 124/90 mmHg (09/28 0700) SpO2:  [94 %-99 %] 97 % (09/28 0700) Weight:  [155 lb 10.3 oz (70.6 kg)-160 lb (72.576 kg)] 155 lb 10.3 oz (70.6 kg) (09/27 2230) Last BM Date: 11/25/12   IS:   Laboratory  CBC  Recent Labs  11/25/12 1800 11/25/12 1811 11/26/12 0600  WBC 23.5*  --  13.1*  HGB 17.6* 18.0* 15.1  HCT 48.9 53.0* 42.7  PLT 206  --  147*   BMET  Recent Labs  11/25/12 1800 11/25/12 1811 11/26/12 0600  NA 136 141 138  K 3.4* 3.3* 3.6  CL 98 105 103  CO2 23  --  25  GLUCOSE 127* 131* 140*  BUN 22 25* 19  CREATININE 0.95 1.30 0.78  CALCIUM 9.4  --  8.6    Radiology Results PORTABLE CHEST - 1 VIEW  COMPARISON: 11/25/2012  FINDINGS:  Multiple left rib fractures and left scapular fracture again noted.  Mild left hemothorax seen, but no pneumothorax visualized. Both  lungs are clear. Heart size and mediastinal contours are stable.  IMPRESSION:  Small left hemothorax. No pneumothorax visualized.  Multiple left rib and scapular fractures again noted.  Electronically Signed  By: Myles Rosenthal  On: 11/26/2012 07:23   Physical Exam General appearance: alert and no distress Resp: clear to auscultation bilaterally Cardio: regular rate and rhythm GI: normal findings: bowel sounds normal and soft, non-tender Extremities: NVI   Assessment/Plan: Fall Concussion -- Supportive care Mult left rib fxs -- Pulmonary toilet T-spine TVP fxs Left scap fx -- NWB, sling Multiple medical problems -- Home meds FEN -- Flex/ex to clear c-spine, awaiting diet on evaluation by ortho VTE -- SCD's, start Lovenox Dispo -- Transfer to floor,  PT/OT   Freeman Caldron, PA-C Pager: [phone] General Trauma PA Pager: [phone]   11/26/2012

## 2012-11-26 NOTE — Progress Notes (Signed)
I have seen and examined the patient and agree with the assessment and plans.  Markala Sitts A. Lashauna Arpin  MD, FACS  

## 2012-11-26 NOTE — Consult Note (Signed)
Reason for Consult:  Left scapula fracture Referring Physician:   Trauma Service  Daniel Mcpherson is an 47 y.o. male.  HPI:   47 yo male who fell late yesterday from a considerable height off of a ladder.  Was admitted to the ICU from the Trauma Service due to multiple left-sided rib fractures.  He is awake and alert and does report left shoulder pain.  He denies numbness in his left arm or hand.  Past Medical History  Diagnosis Date  . Hypertension     History reviewed. No pertinent past surgical history.  No family history on file.  Social History:  reports that he has been smoking.  He does not have any smokeless tobacco history on file. He reports that  drinks alcohol. His drug history is not on file.  Allergies: No Known Allergies  Medications: I have reviewed the patient's current medications.  Results for orders placed during the hospital encounter of 11/25/12 (from the past 48 hour(s))  CDS SEROLOGY     Status: None   Collection Time    11/25/12  6:00 PM      Result Value Range   CDS serology specimen       Value: SPECIMEN WILL BE HELD FOR 14 DAYS IF TESTING IS REQUIRED  COMPREHENSIVE METABOLIC PANEL     Status: Abnormal   Collection Time    11/25/12  6:00 PM      Result Value Range   Sodium 136  135 - 145 mEq/L   Potassium 3.4 (*) 3.5 - 5.1 mEq/L   Chloride 98  96 - 112 mEq/L   CO2 23  19 - 32 mEq/L   Glucose, Bld 127 (*) 70 - 99 mg/dL   BUN 22  6 - 23 mg/dL   Creatinine, Ser 1.61  0.50 - 1.35 mg/dL   Calcium 9.4  8.4 - 09.6 mg/dL   Total Protein 7.4  6.0 - 8.3 g/dL   Albumin 4.2  3.5 - 5.2 g/dL   AST 36  0 - 37 U/L   ALT 24  0 - 53 U/L   Alkaline Phosphatase 102  39 - 117 U/L   Total Bilirubin 0.4  0.3 - 1.2 mg/dL   GFR calc non Af Amer >90  >90 mL/min   GFR calc Af Amer >90  >90 mL/min   Comment: (NOTE)     The eGFR has been calculated using the CKD EPI equation.     This calculation has not been validated in all clinical situations.     eGFR's persistently  <90 mL/min signify possible Chronic Kidney     Disease.  CBC     Status: Abnormal   Collection Time    11/25/12  6:00 PM      Result Value Range   WBC 23.5 (*) 4.0 - 10.5 K/uL   RBC 5.55  4.22 - 5.81 MIL/uL   Hemoglobin 17.6 (*) 13.0 - 17.0 g/dL   HCT 04.5  40.9 - 81.1 %   MCV 88.1  78.0 - 100.0 fL   MCH 31.7  26.0 - 34.0 pg   MCHC 36.0  30.0 - 36.0 g/dL   RDW 91.4  78.2 - 95.6 %   Platelets 206  150 - 400 K/uL  PROTIME-INR     Status: None   Collection Time    11/25/12  6:00 PM      Result Value Range   Prothrombin Time 11.8  11.6 - 15.2 seconds   INR  0.88  0.00 - 1.49  SAMPLE TO BLOOD BANK     Status: None   Collection Time    11/25/12  6:00 PM      Result Value Range   Blood Bank Specimen SAMPLE AVAILABLE FOR TESTING     Sample Expiration 11/26/2012    POCT I-STAT, CHEM 8     Status: Abnormal   Collection Time    11/25/12  6:11 PM      Result Value Range   Sodium 141  135 - 145 mEq/L   Potassium 3.3 (*) 3.5 - 5.1 mEq/L   Chloride 105  96 - 112 mEq/L   BUN 25 (*) 6 - 23 mg/dL   Creatinine, Ser 1.61  0.50 - 1.35 mg/dL   Glucose, Bld 096 (*) 70 - 99 mg/dL   Calcium, Ion 0.45  4.09 - 1.23 mmol/L   TCO2 25  0 - 100 mmol/L   Hemoglobin 18.0 (*) 13.0 - 17.0 g/dL   HCT 81.1 (*) 91.4 - 78.2 %  CG4 I-STAT (LACTIC ACID)     Status: Abnormal   Collection Time    11/25/12  6:12 PM      Result Value Range   Lactic Acid, Venous 2.22 (*) 0.5 - 2.2 mmol/L  MRSA PCR SCREENING     Status: None   Collection Time    11/25/12 10:35 PM      Result Value Range   MRSA by PCR NEGATIVE  NEGATIVE   Comment:            The GeneXpert MRSA Assay (FDA     approved for NASAL specimens     only), is one component of a     comprehensive MRSA colonization     surveillance program. It is not     intended to diagnose MRSA     infection nor to guide or     monitor treatment for     MRSA infections.  CBC     Status: Abnormal   Collection Time    11/26/12  6:00 AM      Result Value Range    WBC 13.1 (*) 4.0 - 10.5 K/uL   RBC 4.78  4.22 - 5.81 MIL/uL   Hemoglobin 15.1  13.0 - 17.0 g/dL   HCT 95.6  21.3 - 08.6 %   MCV 89.3  78.0 - 100.0 fL   MCH 31.6  26.0 - 34.0 pg   MCHC 35.4  30.0 - 36.0 g/dL   RDW 57.8  46.9 - 62.9 %   Platelets 147 (*) 150 - 400 K/uL   Comment: DELTA CHECK NOTED     REPEATED TO VERIFY  BASIC METABOLIC PANEL     Status: Abnormal   Collection Time    11/26/12  6:00 AM      Result Value Range   Sodium 138  135 - 145 mEq/L   Potassium 3.6  3.5 - 5.1 mEq/L   Chloride 103  96 - 112 mEq/L   CO2 25  19 - 32 mEq/L   Glucose, Bld 140 (*) 70 - 99 mg/dL   BUN 19  6 - 23 mg/dL   Creatinine, Ser 5.28  0.50 - 1.35 mg/dL   Comment: DELTA CHECK NOTED   Calcium 8.6  8.4 - 10.5 mg/dL   GFR calc non Af Amer >90  >90 mL/min   GFR calc Af Amer >90  >90 mL/min   Comment: (NOTE)     The eGFR has been  calculated using the CKD EPI equation.     This calculation has not been validated in all clinical situations.     eGFR's persistently <90 mL/min signify possible Chronic Kidney     Disease.    Ct Head Wo Contrast  11/25/2012   *RADIOLOGY REPORT*  Clinical Data:  47 year old male with head and neck pain following a 15 foot fall.  Loss of consciousness.  CT HEAD WITHOUT CONTRAST CT CERVICAL SPINE WITHOUT CONTRAST  Technique:  Multidetector CT imaging of the head and cervical spine was performed following the standard protocol without intravenous contrast.  Multiplanar CT image reconstructions of the cervical spine were also generated.  Comparison:  03/15/2012 cervical spine MR  CT HEAD  Findings: No intracranial abnormalities are identified, including mass lesion or mass effect, hydrocephalus, extra-axial fluid collection, midline shift, hemorrhage, or acute infarction.  The visualized bony calvarium is unremarkable. A mucous retention cyst/polyp within the left maxillary sinus is identified.  IMPRESSION: No evidence of acute intracranial abnormality.  CT CERVICAL SPINE   Findings: Normal alignment is noted. There is no evidence of cervical spine fracture, subluxation or prevertebral soft tissue swelling. Mild to moderate degenerative disc disease/spondylosis is present from C5-C7. Fractures of the left transverse processes of T1 and T2 are noted as well as fractures of the posterior medial left 1st, 2nd and 3rd ribs. There is a minuscule left apical pneumothorax present.  IMPRESSION: Fractures of left T1 and T2 transverse processes and left 1st, 2nd and 3rd ribs.  Minuscule left apical pneumothorax.  No static evidence of acute injury to the cervical spine.   Original Report Authenticated By: Harmon Pier, M.D.   Ct Chest W Contrast  11/25/2012   CLINICAL DATA:  Larey Seat 15 feet from a tree, left shoulder pain  EXAM: CT CHEST, ABDOMEN, AND PELVIS WITH CONTRAST  TECHNIQUE: Multidetector CT imaging of the chest, abdomen and pelvis was performed following the standard protocol during bolus administration of intravenous contrast. Sagittal and coronal MPR images reconstructed from axial data set.  CONTRAST:  80mL OMNIPAQUE IOHEXOL 300 MG/ML SOLN No oral contrast administered.  COMPARISON:  CT abdomen and pelvis 05/01/2004  FINDINGS: CT CHEST FINDINGS  Vascular structures patent on nondedicated exam.  No anterior mediastinal hematoma.  Small posterior mediastinal hematoma is identified anterior and left lateral to the spine.  Dependent atelectasis in bilateral lower lobes greater on left.  Small pulmonary contusion laterally in the left mid lung.  Small anterior left pneumothorax without definite pleural effusion.  Nondisplaced transverse process fractures left T1 through T6.  Minimally displaced fractures of the lateral/ posterior lateral left 3rd through 9th ribs. Comminuted left scapular fracture extending into the glenohumeral joint with displaced articular fragments.  No right-side rib fractures identified.  Vertebral body heights maintained.  CT ABDOMEN AND PELVIS FINDINGS   Low-attenuation mass with nodular peripheral enhancement within right lobe of liver, 4.0 x 4.0 x 2.8 cm, fill-in on delayed images, most consistent with a hepatic hemangioma, unchanged from previous exam.  Liver, spleen, pancreas, kidneys, and adrenal glands normal appearance.  Diverticulosis of descending and sigmoid colon.  Normal appendix.  Stomach and bowel loops otherwise normal appearance.  Mild prostatic enlargement with unremarkable bladder.  Prominent internal inguinal rings bilaterally.  No mass, adenopathy, free fluid, or free air.  No additional fractures.  IMPRESSION: CT CHEST IMPRESSION  Small pulmonary contusion lateral left mid lung.  Bibasilar atelectasis.  Fractures of the left lateral/ posterolateral 3rd through 9th ribs.  Nondisplaced fractures left transverse  processes of T1 through T6.  Comminuted displaced left scapular fracture with intra-articular extension at glenohumeral joint.  Small left pneumothorax.  CT ABDOMEN AND PELVIS IMPRESSION  No acute intra-abdominal or intrapelvic abnormalities.  Large hemangioma right lobe liver 4.0 x 4.0 x 2.8 cm.  Descending and sigmoid colonic diverticulosis.  CriticalValue/emergent results were called by telephone at the time of interpretation on 11/25/2012 at [facility] , who verbally acknowledged these results.   Electronically Signed   By: Ulyses Southward M.D.   On: 11/25/2012 20:00   Ct Cervical Spine Wo Contrast  11/25/2012   *RADIOLOGY REPORT*  Clinical Data:  47 year old male with head and neck pain following a 15 foot fall.  Loss of consciousness.  CT HEAD WITHOUT CONTRAST CT CERVICAL SPINE WITHOUT CONTRAST  Technique:  Multidetector CT imaging of the head and cervical spine was performed following the standard protocol without intravenous contrast.  Multiplanar CT image reconstructions of the cervical spine were also generated.  Comparison:  03/15/2012 cervical spine MR  CT HEAD  Findings: No intracranial abnormalities are identified,  including mass lesion or mass effect, hydrocephalus, extra-axial fluid collection, midline shift, hemorrhage, or acute infarction.  The visualized bony calvarium is unremarkable. A mucous retention cyst/polyp within the left maxillary sinus is identified.  IMPRESSION: No evidence of acute intracranial abnormality.  CT CERVICAL SPINE  Findings: Normal alignment is noted. There is no evidence of cervical spine fracture, subluxation or prevertebral soft tissue swelling. Mild to moderate degenerative disc disease/spondylosis is present from C5-C7. Fractures of the left transverse processes of T1 and T2 are noted as well as fractures of the posterior medial left 1st, 2nd and 3rd ribs. There is a minuscule left apical pneumothorax present.  IMPRESSION: Fractures of left T1 and T2 transverse processes and left 1st, 2nd and 3rd ribs.  Minuscule left apical pneumothorax.  No static evidence of acute injury to the cervical spine.   Original Report Authenticated By: Harmon Pier, M.D.   Ct Abdomen Pelvis W Contrast  11/25/2012   CLINICAL DATA:  Larey Seat 15 feet from a tree, left shoulder pain  EXAM: CT CHEST, ABDOMEN, AND PELVIS WITH CONTRAST  TECHNIQUE: Multidetector CT imaging of the chest, abdomen and pelvis was performed following the standard protocol during bolus administration of intravenous contrast. Sagittal and coronal MPR images reconstructed from axial data set.  CONTRAST:  80mL OMNIPAQUE IOHEXOL 300 MG/ML SOLN No oral contrast administered.  COMPARISON:  CT abdomen and pelvis 05/01/2004  FINDINGS: CT CHEST FINDINGS  Vascular structures patent on nondedicated exam.  No anterior mediastinal hematoma.  Small posterior mediastinal hematoma is identified anterior and left lateral to the spine.  Dependent atelectasis in bilateral lower lobes greater on left.  Small pulmonary contusion laterally in the left mid lung.  Small anterior left pneumothorax without definite pleural effusion.  Nondisplaced transverse process  fractures left T1 through T6.  Minimally displaced fractures of the lateral/ posterior lateral left 3rd through 9th ribs. Comminuted left scapular fracture extending into the glenohumeral joint with displaced articular fragments.  No right-side rib fractures identified.  Vertebral body heights maintained.  CT ABDOMEN AND PELVIS FINDINGS  Low-attenuation mass with nodular peripheral enhancement within right lobe of liver, 4.0 x 4.0 x 2.8 cm, fill-in on delayed images, most consistent with a hepatic hemangioma, unchanged from previous exam.  Liver, spleen, pancreas, kidneys, and adrenal glands normal appearance.  Diverticulosis of descending and sigmoid colon.  Normal appendix.  Stomach and bowel loops otherwise normal appearance.  Mild prostatic enlargement with unremarkable bladder.  Prominent internal inguinal rings bilaterally.  No mass, adenopathy, free fluid, or free air.  No additional fractures.  IMPRESSION: CT CHEST IMPRESSION  Small pulmonary contusion lateral left mid lung.  Bibasilar atelectasis.  Fractures of the left lateral/ posterolateral 3rd through 9th ribs.  Nondisplaced fractures left transverse processes of T1 through T6.  Comminuted displaced left scapular fracture with intra-articular extension at glenohumeral joint.  Small left pneumothorax.  CT ABDOMEN AND PELVIS IMPRESSION  No acute intra-abdominal or intrapelvic abnormalities.  Large hemangioma right lobe liver 4.0 x 4.0 x 2.8 cm.  Descending and sigmoid colonic diverticulosis.  CriticalValue/emergent results were called by telephone at the time of interpretation on 11/25/2012 at [facility] , who verbally acknowledged these results.   Electronically Signed   By: Ulyses Southward M.D.   On: 11/25/2012 20:00   Dg Pelvis Portable  11/25/2012   CLINICAL DATA:  Prominent, fell 15 feet from a tree  EXAM: PORTABLE PELVIS  COMPARISON:  Portable exam 1750 hr without priors for comparison.  FINDINGS: Symmetric hip and SI joints.  Osseous  mineralization normal.  No acute fracture, dislocation or bone destruction.  IMPRESSION: No acute abnormalities.   Electronically Signed   By: Ulyses Southward M.D.   On: 11/25/2012 18:09   Dg Chest Port 1 View  11/26/2012   CLINICAL DATA:  Left rib fractures and traumatic pneumothorax.  EXAM: PORTABLE CHEST - 1 VIEW  COMPARISON:  11/25/2012  FINDINGS: Multiple left rib fractures and left scapular fracture again noted. Mild left hemothorax seen, but no pneumothorax visualized. Both lungs are clear. Heart size and mediastinal contours are stable.  IMPRESSION: Small left hemothorax. No pneumothorax visualized.  Multiple left rib and scapular fractures again noted.   Electronically Signed   By: Myles Rosenthal   On: 11/26/2012 07:23   Dg Chest Port 1 View  11/25/2012   CLINICAL DATA:  Trauma, fell 18 feet from a tree  EXAM: PORTABLE CHEST - 1 VIEW  COMPARISON:  Portable exam 1749 hr without priors for comparison.  FINDINGS: Normal heart size, mediastinal contours, and pulmonary vascularity.  Lungs clear.  No pleural effusion or pneumothorax.  Minimally displaced fractures are identified at the lateral aspects of the left 3rd 4th 5th and 6th ribs. Question additional nondisplaced fracture of left 7th rib. Left shoulder joint is inadequately visualized at the margin of the film, partially obscured by text, with poor visualization of the left glenoid noted.  IMPRESSION: Fractures of left 3rd, 4th, 5th, 6th and questionably 7th ribs. Left glenoid is poorly visualized, partially obscured by text ; if there is clinical concern for left shoulder injury, recommend dedicated radiographs.   Electronically Signed   By: Ulyses Southward M.D.   On: 11/25/2012 18:08   Dg Shoulder Left Port  11/25/2012   CLINICAL DATA:  Initial encounter for left scapular injury. Patient fell from a tree.  EXAM: PORTABLE LEFT SHOULDER - 2+ VIEW  COMPARISON:  None.  FINDINGS: Severely comminuted fracture involving the scapula. The fracture line may involve  the superior aspect of the glenoid. The glenohumeral joint appears anatomically aligned. There may be slight acromioclavicular separation. No fractures are identified involving the clavicle or the proximal humerus.  IMPRESSION: Severely comminuted fracture involving the left scapula. Possible mild AC separation.   Electronically Signed   By: Hulan Saas   On: 11/25/2012 18:56    ROS Blood pressure 133/94, pulse 99, temperature 99 F (37.2 C),  temperature source Oral, resp. rate 18, height 5\' 6"  (1.676 m), weight 70.6 kg (155 lb 10.3 oz), SpO2 99.00%. Physical Exam  Musculoskeletal:       Left shoulder: He exhibits bony tenderness and swelling.  There are superficial abrasions laterally.  It's difficult to assess axillary nerve function due to pain with attempted shoulder abduction. Left distal UE (elbow, hand, wrist) has normal motor and sensory function. Radial and ulnar pulses are palpable. Right UE and bil LE exams are normal. Pelvic stable with AP/Lat compression  Assessment/Plan: Complex left scapula fracture with extension into the glenoid. 1)  I spoke to him in length regarding the severity of his left scapula injury and the involvement of his glenohumeral joint.  This will likely require surgery.  I will obtain a CT scan of the shoulder today with 3-D recons.  I will also need to consult Dr. Carola Frost, Ortho Trauma, for his input given this complex injury.  Daniel Mcpherson Y 11/26/2012, 8:18 AM

## 2012-11-26 NOTE — Evaluation (Signed)
Physical Therapy Evaluation Patient Details Name: Daniel Mcpherson MRN: [id number] DOB: 1965/07/11 Today's Date: 11/26/2012 Time: 4098-1191 PT Time Calculation (min): 46 min  PT Assessment / Plan / Recommendation History of Present Illness  The pt is a 47yo wm who was on a ladder sawing a limb with a chain saw. The limb swung back at him and knocked him off the ladder. He fell about 48ft onto his left side. He did lose consciousness. No hypotension. He complains of left shoulder pain; L scapular fx, L rib fx, transverse process fxs  Clinical Impression  Pt admitted with above. Pt currently with functional limitations due to the deficits listed below (see PT Problem List).  Pt will benefit from skilled PT to increase their independence and safety with mobility to allow discharge to the venue listed below.       PT Assessment  Patient needs continued PT services    Follow Up Recommendations  Home health PT;Supervision - Intermittent    Does the patient have the potential to tolerate intense rehabilitation      Barriers to Discharge Decreased caregiver support Will need to be modified independent to dc home    Equipment Recommendations  None recommended by PT (perhaps cane, tbd)    Recommendations for Other Services OT consult   Frequency Min 5X/week    Precautions / Restrictions Precautions Precautions: Fall Required Braces or Orthoses: Sling Restrictions Weight Bearing Restrictions: Yes LUE Weight Bearing: Non weight bearing   Pertinent Vitals/Pain 10+/10 with movement, especially bed mobility; Discussed the possibility of sleeping in recliner at home;  patient repositioned for comfort RN provided medication to assist with pain control       Mobility  Bed Mobility Bed Mobility: Supine to Sit;Sitting - Scoot to Edge of Bed Supine to Sit: 3: Mod assist;With rails;HOB elevated Sitting - Scoot to Edge of Bed: 4: Min guard Details for Bed Mobility Assistance: moved extremely  slowly secondary to pain; cues for different technique (roll to right, spin and pull up), and pt ultimately chose to turn in the bed, let feet off of bed, and pull up to sit, holding to PT's elbow (and vice versa) Transfers Transfers: Sit to Stand;Stand to Sit Sit to Stand: 4: Min guard;From bed Stand to Sit: 4: Min guard;To chair/3-in-1 Details for Transfer Assistance: Very slow moving because of pain, but did it without physcial assist Ambulation/Gait Ambulation/Gait Assistance: 4: Min guard;5: Supervision Ambulation Distance (Feet): 200 Feet Assistive device: None;Other (Comment) (and pushing IV pole) Ambulation/Gait Assistance Details: steady once on feet; reports feeling like his back and L shoulder were cramping diring amb Gait Pattern: Decreased stride length;Step-through pattern Gait velocity: decr    Exercises     PT Diagnosis: Acute pain  PT Problem List: Decreased strength;Decreased range of motion;Decreased activity tolerance;Decreased mobility;Decreased coordination;Decreased knowledge of use of DME;Pain PT Treatment Interventions: DME instruction;Gait training;Stair training;Functional mobility training;Therapeutic activities;Therapeutic exercise;Patient/family education     PT Goals(Current goals can be found in the care plan section) Acute Rehab PT Goals Patient Stated Goal: less pain PT Goal Formulation: With patient Time For Goal Achievement: 12/10/12 Potential to Achieve Goals: Good  Visit Information  Last PT Received On: 11/26/12 Assistance Needed: +1 History of Present Illness: The pt is a 47yo wm who was on a ladder sawing a limb with a chain saw. The limb swung back at him and knocked him off the ladder. He fell about 5ft onto his left side. He did lose consciousness. No hypotension. He complains  of left shoulder pain; L scapular fx, L rib fx, transverse process fxs       Prior Functioning  Home Living Family/patient expects to be discharged to:: Private  residence Living Arrangements: Other relatives Available Help at Discharge: Family;Friend(s);Available PRN/intermittently Type of Home: House Home Access: Stairs to enter [employer] of Steps: 2 Entrance Stairs-Rails: Right Home Layout: One level Home Equipment: None Prior Function Level of Independence: Independent Communication Communication: No difficulties Dominant Hand: Right    Cognition  Cognition Arousal/Alertness: Awake/alert Behavior During Therapy: WFL for tasks assessed/performed Overall Cognitive Status: Within Functional Limits for tasks assessed    Extremity/Trunk Assessment Upper Extremity Assessment Upper Extremity Assessment: LUE deficits/detail;Defer to OT evaluation LUE Deficits / Details: Painful and immobilzed in sling Lower Extremity Assessment Lower Extremity Assessment: Overall WFL for tasks assessed (though whole baody painful with any movement)   Balance    End of Session PT - End of Session Equipment Utilized During Treatment: Other (comment) (sling) Activity Tolerance: Patient limited by pain;Patient tolerated treatment well Patient left: in chair;with call bell/phone within reach;with family/visitor present Nurse Communication: Mobility status  GP     Van Clines [facility] [place], [place] [phone]  11/26/2012, 6:12 PM

## 2012-11-26 NOTE — Progress Notes (Signed)
Orthopedic Tech Progress Note Patient Details:  Daniel Mcpherson 03/22/1965 [id number] Sling immobilizer applied to Left UE. Application tolerated well.  Ortho Devices Type of Ortho Device: Shoulder immobilizer Ortho Device/Splint Location: Left UE Ortho Device/Splint Interventions: Application   Asia R Thompson 11/26/2012, 11:49 AM

## 2012-11-27 MED ORDER — INFLUENZA VAC SPLIT QUAD 0.5 ML IM SUSP: 0.5000 mL | INTRAMUSCULAR | Status: DC | PRN

## 2012-11-27 MED ORDER — PNEUMOCOCCAL VAC POLYVALENT 25 MCG/0.5ML IJ INJ: 0.5000 mL | INJECTION | INTRAMUSCULAR | Status: DC | PRN

## 2012-11-27 NOTE — Progress Notes (Signed)
Patient ID: Daniel Mcpherson, male   DOB: 1965-09-13, 47 y.o.   MRN: [id number]   LOS: 2 days   Subjective: No unexpected c/o. Pain controlled on oral meds.   Objective: Vital signs in last 24 hours: Temp:  [97.6 F (36.4 C)-98.9 F (37.2 C)] 98.7 F (37.1 C) (09/29 0629) Pulse Rate:  [95-107] 102 (09/29 0629) Resp:  [18-19] 18 (09/29 0629) BP: (115-135)/(80-97) 128/93 mmHg (09/29 0629) SpO2:  [91 %-100 %] 91 % (09/29 0629) Last BM Date: 11/25/12   IS: (=)   Physical Exam General appearance: alert and no distress Resp: clear to auscultation bilaterally Cardio: regular rate and rhythm GI: normal findings: bowel sounds normal and soft, non-tender Extremities: NVI   Assessment/Plan: Fall  Concussion -- Supportive care  Mult left rib fxs -- Pulmonary toilet  T-spine TVP fxs  Left scap fx -- NWB, sling. Dr. Magnus Ivan to review case with Dr. Carola Frost today Multiple medical problems -- Home meds  FEN -- SL IV VTE -- SCD's, Lovenox  Dispo -- PT/OT, ? Surgery on shoulder    Freeman Caldron, PA-C Pager: [phone] General Trauma PA Pager: [phone]   11/27/2012

## 2012-11-27 NOTE — Progress Notes (Signed)
Orthopedic Tech Progress Note Patient Details:  Daniel Mcpherson [birth year] [id number]  Ortho Devices Type of Ortho Device: Shoulder immobilizer Ortho Device/Splint Location: Replacement shoulder immobilizer Ortho Device/Splint Interventions: Application   Cammer, Mickie Bail 11/27/2012, 3:18 PM

## 2012-11-27 NOTE — Progress Notes (Signed)
I have reviewed this note and agree with all findings. Kati Brooklyne Radke, PT, DPT Pager: [phone]   

## 2012-11-27 NOTE — Progress Notes (Signed)
UR completed.  Thania Woodlief, RN BSN [facility] CCM Trauma/Neuro ICU Case Manager [phone]  

## 2012-11-27 NOTE — Progress Notes (Signed)
Patient ID: Daniel Mcpherson, male   DOB: 1966-02-19, 47 y.o.   MRN: [id number] Comfortable in left arm sling this am.  Denies numbness left hand or weakness.  Considerable left shoulder pain.  CT scan of left shoulder with 3D recons reviewed.  This is quite a complex scapula fracture that significantly involves the glenoid.  I will consult with Dr. Carola Frost, Ortho Trauma, due to the complexity of this injury.

## 2012-11-27 NOTE — Progress Notes (Signed)
No note from Dr. Carola Frost about this patient yet.  This patient has been seen and I agree with the findings and treatment plan.  Marta Lamas. Gae Bon, MD, FACS [phone] (pager) [phone] (direct pager) Trauma Surgeon

## 2012-11-27 NOTE — Evaluation (Addendum)
Occupational Therapy Evaluation Patient Details Name: Daniel Mcpherson MRN: [id number] DOB: September 20, 1965 Today's Date: 11/27/2012 Time: 4098-1191 OT Time Calculation (min): 21 min  OT Assessment / Plan / Recommendation History of present illness The pt is a 47yo wm who was on a ladder sawing a limb with a chain saw. The limb swung back at him and knocked him off the ladder. He fell about 23ft onto his left side. He did lose consciousness. No hypotension. He complains of left shoulder pain; L scapular fx, L rib fx, transverse process fxs   Clinical Impression   Pt admitted with above. Pt currently with functional limitations due to the deficits listed below (see OT Problem List). Pt independent with ADLs, PTA.  Pt will benefit from skilled OT to increase their independence and safety to allow discharge to the venue listed below.      OT Assessment  Patient needs continued OT Services    Follow Up Recommendations  Home health OT;Supervision - Intermittent    Barriers to Discharge      Equipment Recommendations  Other (comment) (tub/shower equipment tbd)    Recommendations for Other Services    Frequency  Min 2X/week    Precautions / Restrictions Precautions Precautions: Fall Required Braces or Orthoses: Sling Restrictions Weight Bearing Restrictions: Yes LUE Weight Bearing: Non weight bearing   Pertinent Vitals/Pain Pain 4/10 in scapula and also ribs. Nurse notified.     ADL  Upper Body Bathing: Maximal assistance Where Assessed - Upper Body Bathing: Unsupported sitting Lower Body Bathing: Min guard Where Assessed - Lower Body Bathing: Unsupported sit to stand Upper Body Dressing: Maximal assistance Where Assessed - Upper Body Dressing: Unsupported sitting Lower Body Dressing: Minimal assistance Where Assessed - Lower Body Dressing: Unsupported sit to stand Toilet Transfer: Min guard Toilet Transfer Method: Sit to Barista: Comfort height  toilet Tub/Shower Transfer Method: Not assessed Equipment Used: Gait belt;Other (comment) (shoulder sling) Transfers/Ambulation Related to ADLs: Min guard-pt did slightly lose balance on one occassion. Min guard for transfers. ADL Comments: Pt practiced toilet transfer and also ambulated in hallway. OT educated to be moving wrist and fingers to prevent stiffness of those joints. Educated for NWB of LUE and gave cues during session. Educated to not move shoulder at this time- pt awaiting to see if he is going to have surgery on shoulder.  Educated on use of elastic shoe laces as I suspect tying shoes will be difficult.  Pt was able to get socks on and off while sitting. Educated on putting pillow behind left arm to prevent it from moving backwards.  Educated on correct position of sling.   OT Diagnosis: Acute pain  OT Problem List: Decreased activity tolerance;Impaired balance (sitting and/or standing);Decreased knowledge of use of DME or AE;Decreased knowledge of precautions;Pain;Impaired UE functional use;Decreased range of motion OT Treatment Interventions: Self-care/ADL training;DME and/or AE instruction;Therapeutic exercise;Therapeutic activities;Patient/family education;Balance training   OT Goals(Current goals can be found in the care plan section) Acute Rehab OT Goals Patient Stated Goal: Go home OT Goal Formulation: With patient Time For Goal Achievement: 12/04/12 Potential to Achieve Goals: Good ADL Goals Pt Will Perform Upper Body Bathing: with set-up;with supervision;sitting Pt Will Perform Lower Body Bathing: with set-up;with supervision;sit to/from stand Pt Will Perform Upper Body Dressing: with set-up;with supervision;sitting Pt Will Perform Lower Body Dressing: with set-up;with supervision;sit to/from stand Pt Will Transfer to Toilet: with modified independence;ambulating (comfort height toilet) Pt Will Perform Toileting - Clothing Manipulation and hygiene: with modified  independence;sit to/from stand Pt Will Perform Tub/Shower Transfer: Tub transfer;Shower transfer;with supervision;ambulating (tub/shower equipment tbd) Additional ADL Goal #1: Pt will be independent with HEP of LUE.  Visit Information  Last OT Received On: 11/27/12 Assistance Needed: +1 History of Present Illness: The pt is a 47yo wm who was on a ladder sawing a limb with a chain saw. The limb swung back at him and knocked him off the ladder. He fell about 27ft onto his left side. He did lose consciousness. No hypotension. He complains of left shoulder pain; L scapular fx, L rib fx, transverse process fxs       Prior Functioning     Home Living Family/patient expects to be discharged to:: Private residence Living Arrangements: Other relatives Available Help at Discharge: Family;Friend(s);Available PRN/intermittently (may go stay with dad) Type of Home: House Home Access: Stairs to enter Entrance Stairs-Number of Steps: 2 Entrance Stairs-Rails: Right Home Layout: One level Home Equipment: None Prior Function Level of Independence: Independent Communication Communication: No difficulties Dominant Hand: Right         Vision/Perception     Cognition  Cognition Arousal/Alertness: Awake/alert Behavior During Therapy: WFL for tasks assessed/performed Overall Cognitive Status: Within Functional Limits for tasks assessed    Extremity/Trunk Assessment Upper Extremity Assessment Upper Extremity Assessment: LUE deficits/detail LUE Deficits / Details: Painful and immobilzed in sling Lower Extremity Assessment Lower Extremity Assessment: Defer to PT evaluation     Mobility Bed Mobility Bed Mobility: Not assessed Details for Bed Mobility Assistance: pt in chair upon entering room Transfers Transfers: Sit to Stand;Stand to Sit Sit to Stand: 4: Min guard;From chair/3-in-1;From toilet Stand to Sit: 4: Min guard;To chair/3-in-1;To toilet Details for Transfer Assistance: Min  guard for safety.      Exercise     Balance     End of Session OT - End of Session Equipment Utilized During Treatment: Gait belt;Other (comment) (shoulder sling) Activity Tolerance: Patient tolerated treatment well Patient left: in chair;with call bell/phone within reach Nurse Communication: Other (comment) (pain level and sling too big)  GO     Earlie Raveling OTR/L [phone] 11/27/2012, 1:19 PM

## 2012-11-27 NOTE — Progress Notes (Signed)
Physical Therapy Treatment Patient Details Name: Daniel Mcpherson MRN: [id number] DOB: 1966-02-12 Today's Date: 11/27/2012 Time: 4132-4401 PT Time Calculation (min): 33 min  PT Assessment / Plan / Recommendation  History of Present Illness The pt is a 47yo wm who was on a ladder sawing a limb with a chain saw. The limb swung back at him and knocked him off the ladder. He fell about 41ft onto his left side. He did lose consciousness. No hypotension. He complains of left shoulder pain; L scapular fx, L rib fx, transverse process fxs   PT Comments   Pt progressing well as he was able to amb. 500' and traverse 3 steps with supervision to min guard to ensure safety.  Pt continues to require standing rest breaks as L shoulder pain limits mobility tolerance. Pt would continue to benefit from skilled PT in order to improve functional mobility. Pt reports he does not have handrails at home, PT feels pt would benefit from practicing steps without handrails once more prior to d/c home.  Follow Up Recommendations  Home health PT;Supervision - Intermittent     Does the patient have the potential to tolerate intense rehabilitation     Barriers to Discharge        Equipment Recommendations  None recommended by PT    Recommendations for Other Services    Frequency Min 5X/week   Progress towards PT Goals Progress towards PT goals: Progressing toward goals  Plan      Precautions / Restrictions Precautions Precautions: Fall Required Braces or Orthoses: Sling (L shoulder sling) Restrictions Weight Bearing Restrictions: Yes LUE Weight Bearing: Non weight bearing   Pertinent Vitals/Pain Pt reports 1-2/10 L shoulder pain/cramping at rest with increase to 3-4/10 during amb. Pt took standing rest breaks during ambulation in order to decrease pain. Pt positioned to comfort at end of session and encouraged to call RN in order to ambulate in room or hallway for safety.    Mobility  Bed Mobility Bed Mobility:  Not assessed Details for Bed Mobility Assistance: pt in chair upon entering room Transfers Transfers: Sit to Stand;Stand to Sit Sit to Stand: 4: Min guard;From chair/3-in-1;With armrests Stand to Sit: 4: Min guard;To chair/3-in-1;With armrests Details for Transfer Assistance: min guard to ensure safety and to adhere to precations. Pt required increased time in order to perform sit<>stand in order to "work throught the L scapula pain/cramping". Ambulation/Gait Ambulation/Gait Assistance: 4: Min guard Ambulation Distance (Feet): 500 Feet Assistive device: None Ambulation/Gait Assistance Details: Min guard to ensure safety with pt progressing to supervision after 50' of amb.  No LOB episodes, but pt required frequent standing rest breaks due to L shoulder pain/cramping.   Gait Pattern: Decreased stride length;Step-through pattern;Wide base of support Gait velocity: Decreased General Gait Details: PT and pt discussed use of SPC but pt did not experience any LOB during amb, so SPC not trialed. Stairs: Yes Stairs Assistance: 4: Min IT consultant Details (indicate cue type and reason): Pt able to ascend/descend 3 steps with no rails, as pt does not have rails at home, all with min guard to ensure safety. Stair Management Technique: No rails;Step to pattern Number of Stairs: 3    Exercises     PT Diagnosis:    PT Problem List:   PT Treatment Interventions:     PT Goals (current goals can now be found in the care plan section) Acute Rehab PT Goals Patient Stated Goal: Go home  Visit Information  Last PT Received On:  11/27/12 Assistance Needed: +1 History of Present Illness: The pt is a 47yo wm who was on a ladder sawing a limb with a chain saw. The limb swung back at him and knocked him off the ladder. He fell about 17ft onto his left side. He did lose consciousness. No hypotension. He complains of left shoulder pain; L scapular fx, L rib fx, transverse process fxs    Subjective  Data  Patient Stated Goal: Go home   Cognition  Cognition Arousal/Alertness: Awake/alert Behavior During Therapy: WFL for tasks assessed/performed Overall Cognitive Status: Within Functional Limits for tasks assessed    Balance     End of Session PT - End of Session Equipment Utilized During Treatment: Other (comment) (L UE sling) Activity Tolerance: Patient limited by pain;Patient tolerated treatment well Patient left: in chair;with call bell/phone within reach (PT encouraged pt to call RN to get OOB/chair for safety.)   GP     Daniel Mcpherson 11/27/2012, 1:17 PM

## 2012-11-28 MED ORDER — METHOCARBAMOL 500 MG PO TABS: 500.0000 mg | ORAL_TABLET | Freq: Four times a day (QID) | ORAL | Status: DC | PRN

## 2012-11-28 MED ORDER — OXYCODONE-ACETAMINOPHEN 5-325 MG PO TABS: 1.0000 | ORAL_TABLET | ORAL | Status: DC | PRN

## 2012-11-28 NOTE — Progress Notes (Signed)
Physical Therapy Treatment Patient Details Name: Daniel Mcpherson MRN: [id number] DOB: 07-27-1965 Today's Date: 11/28/2012 Time: 1308-6578 PT Time Calculation (min): 24 min  PT Assessment / Plan / Recommendation  History of Present Illness The pt is a 47yo wm who was on a ladder sawing a limb with a chain saw. The limb swung back at him and knocked him off the ladder. He fell about 79ft onto his left side. He did lose consciousness. No hypotension. He complains of left shoulder pain; L scapular fx, L rib fx, transverse process fxs   PT Comments   *Pt is ambulating 500' independently without LOB, able to go up/down 3 stairs without rail. OK to DC home from PT standpoint. **  Follow Up Recommendations  Supervision - Intermittent;No PT follow up     Does the patient have the potential to tolerate intense rehabilitation     Barriers to Discharge        Equipment Recommendations  None recommended by PT    Recommendations for Other Services    Frequency Min 5X/week   Progress towards PT Goals Progress towards PT goals: Goals met/education completed, patient discharged from PT  Plan Current plan remains appropriate    Precautions / Restrictions Precautions Precautions: Fall Required Braces or Orthoses: Sling Restrictions Weight Bearing Restrictions: Yes LUE Weight Bearing: Non weight bearing   Pertinent Vitals/Pain *7/10 L shoulder, gripping pain Premedicated for pain, Spoke to RN to request muscle relaxor**    Mobility  Bed Mobility Bed Mobility: Not assessed Details for Bed Mobility Assistance: pt in chair upon entering room Transfers Transfers: Sit to Stand;Stand to Sit Sit to Stand: From chair/3-in-1;With armrests;5: Supervision Stand to Sit: To chair/3-in-1;With armrests;5: Supervision Details for Transfer Assistance: increased time 2* LUE pain -premedicated for pain, muscle relaxor requested Ambulation/Gait Ambulation/Gait Assistance: 5: independent Ambulation Distance  (Feet): 500 Feet Assistive device: None Gait Pattern: Decreased stride length;Step-through pattern;Wide base of support Stairs: Yes Stairs Assistance: 5: Supervision Stair Management Technique: No rails;Step to pattern;Forwards Number of Stairs: 3    Exercises Hand Exercises Digit Composite Flexion: AROM;Left;5 reps;Standing Composite Extension: AROM;Left;5 reps;Standing   PT Diagnosis:    PT Problem List:   PT Treatment Interventions:     PT Goals (current goals can now be found in the care plan section) Acute Rehab PT Goals Patient Stated Goal: Go home PT Goal Formulation: With patient Time For Goal Achievement: 12/10/12 Potential to Achieve Goals: Good  Visit Information  Last PT Received On: 11/28/12 History of Present Illness: The pt is a 47yo wm who was on a ladder sawing a limb with a chain saw. The limb swung back at him and knocked him off the ladder. He fell about 31ft onto his left side. He did lose consciousness. No hypotension. He complains of left shoulder pain; L scapular fx, L rib fx, transverse process fxs    Subjective Data  Patient Stated Goal: Go home   Cognition  Cognition Arousal/Alertness: Awake/alert Behavior During Therapy: WFL for tasks assessed/performed Overall Cognitive Status: Within Functional Limits for tasks assessed    Balance     End of Session PT - End of Session Equipment Utilized During Treatment: Other (comment) (L UE sling) Activity Tolerance: Patient limited by pain;Patient tolerated treatment well Patient left: in chair;with call bell/phone within reach;with family/visitor present (PT encouraged pt to call RN to get OOB/chair for safety.) Nurse Communication: Mobility status   GP     Ralene Bathe Kistler 11/28/2012, 1:18 PM [phone]

## 2012-11-28 NOTE — Discharge Instructions (Signed)
Wear sling on your left shoulder for comfort. You can remove your sling for showers and to occasionally move your elbow. No lifting with your left arm or lifting your left shoulder.  No driving while taking oxycodone.

## 2012-11-28 NOTE — Progress Notes (Signed)
Occupational Therapy Treatment Patient Details Name: Daniel Mcpherson MRN: [id number] DOB: 11/13/1965 Today's Date: 11/28/2012 Time: 3086-5784 OT Time Calculation (min): 41 min  OT Assessment / Plan / Recommendation  History of present illness The pt is a 47yo wm who was on a ladder sawing a limb with a chain saw. The limb swung back at him and knocked him off the ladder. He fell about 5ft onto his left side. He did lose consciousness. No hypotension. He complains of left shoulder pain; L scapular fx, L rib fx, transverse process fxs   OT comments  Pt progressing towards goals.Provided shoulder handout and educated on information and pt practiced ADLs as well (see ADL comments).  Pt planning to d/c today.   Follow Up Recommendations  Supervision - Intermittent;No OT follow up    Barriers to Discharge       Equipment Recommendations  None recommended by OT    Recommendations for Other Services    Frequency Min 2X/week   Progress towards OT Goals Progress towards OT goals: Progressing toward goals  Plan Discharge plan needs to be updated    Precautions / Restrictions Precautions Precautions: Fall Required Braces or Orthoses: Sling Restrictions Weight Bearing Restrictions: Yes LUE Weight Bearing: Non weight bearing   Pertinent Vitals/Pain Pain in Lt shoulder. Nurse notified.     ADL  Upper Body Dressing: Minimal assistance (Min A for shirt-see ADL comments for sling) Where Assessed - Upper Body Dressing: Unsupported standing Lower Body Dressing: Supervision/safety;Set up Where Assessed - Lower Body Dressing: Unsupported sit to stand Toilet Transfer: Supervision/safety Toilet Transfer Method: Sit to Barista: Regular height toilet Tub/Shower Transfer: Landscape architect Method: Science writer: Walk in Scientist, research (physical sciences) Used: Other (comment) (shoulder sling) Transfers/Ambulation Related to ADLs:  Supervision ADL Comments: Gave shoulder handout to pt/father. Reviewed shoulder information. Practiced shower transfer and educated to sit to bathe. Pt able to don shirt with Min A and dad practiced donning sling-Min A to help position it and supervision level for donning, but handout provided. Pt practiced simulated regular height toilet transfer. He was able to don shorts. Educated to sit to put over feet and stand in front of bed/chair when pulling up LB clothing for safety.  Performed approximately 10 reps of each exercise for ROM of elbow, wrist, and hand.    OT Diagnosis:    OT Problem List:   OT Treatment Interventions:     OT Goals(current goals can now be found in the care plan section) Acute Rehab OT Goals Patient Stated Goal: not stated OT Goal Formulation: With patient Time For Goal Achievement: 12/04/12 Potential to Achieve Goals: Good ADL Goals Pt Will Perform Upper Body Bathing: with set-up;with supervision;sitting Pt Will Perform Lower Body Bathing: with set-up;with supervision;sit to/from stand Pt Will Perform Upper Body Dressing: with set-up;with supervision;sitting Pt Will Perform Lower Body Dressing: with set-up;with supervision;sit to/from stand Pt Will Transfer to Toilet: with modified independence;ambulating (comfort height toilet) Pt Will Perform Toileting - Clothing Manipulation and hygiene: with modified independence;sit to/from stand Pt Will Perform Tub/Shower Transfer: Tub transfer;Shower transfer;with supervision;ambulating (tub/shower equipment tbd) Additional ADL Goal #1: Pt/caregiver will be independent in donning/doffing shoulder sling and posititioning it correctly.  Visit Information  Last OT Received On: 11/28/12 Assistance Needed: +1 History of Present Illness: The pt is a 47yo wm who was on a ladder sawing a limb with a chain saw. The limb swung back at him and knocked him off the ladder. He fell about  22ft onto his left side. He did lose consciousness.  No hypotension. He complains of left shoulder pain; L scapular fx, L rib fx, transverse process fxs    Subjective Data      Prior Functioning       Cognition  Cognition Arousal/Alertness: Awake/alert Behavior During Therapy: WFL for tasks assessed/performed Overall Cognitive Status: Within Functional Limits for tasks assessed    Mobility  Bed Mobility Bed Mobility: Not assessed Transfers Transfers: Sit to Stand;Stand to Sit Sit to Stand: 5: Supervision;From chair/3-in-1;From toilet Stand to Sit: 5: Supervision;To chair/3-in-1;To toilet Details for Transfer Assistance: Supervision for safety.     Exercises  Shoulder Exercises Elbow Flexion: AROM;Left;10 reps;Standing Elbow Extension: AROM;Left;10 reps;Standing Wrist Flexion: AROM;Left;10 reps;Standing Wrist Extension: AROM;Left;10 reps;Standing Digit Composite Flexion: AROM;Left;10 reps;Standing Composite Extension: AROM;Left;10 reps;Standing Donning/doffing shirt without moving shoulder: Minimal assistance (for donning) Method for sponge bathing under operated UE:  (educated pt and his dad) Donning/doffing sling/immobilizer: Supervision/safety Correct positioning of sling/immobilizer: Minimal assistance ROM for elbow, wrist and digits of operated UE: Supervision/safety Sling wearing schedule (on at all times/off for ADL's):  (educated) Proper positioning of operated UE when showering:  (educated) Positioning of UE while sleeping:  (educated )   Balance     End of Session OT - End of Session Equipment Utilized During Treatment: Other (comment) (shoulder sling) Activity Tolerance: Patient tolerated treatment well Patient left: in chair;with family/visitor present Nurse Communication: Other (comment) (ready for d/c)  GO     Earlie Raveling OTR/L [phone] 11/28/2012, 5:57 PM

## 2012-11-28 NOTE — Progress Notes (Signed)
Physical Therapy Discharge Patient Details Name: Daniel Mcpherson MRN: [id number] DOB: April 23, 1965 Today's Date: 11/28/2012 Time: 0865-7846 PT Time Calculation (min): 24 min  Patient discharged from PT services secondary to goals met and no further PT needs identified.  Please see latest therapy progress note for current level of functioning and progress toward goals.    Progress and discharge plan discussed with patient and/or caregiver: Patient/Caregiver agrees with plan  GP     Tamala Ser 11/28/2012, 1:21 PM  [phone]

## 2012-11-28 NOTE — Discharge Summary (Signed)
Physician Discharge Summary  Patient ID: Daniel Mcpherson MRN: [id number] DOB/AGE: May 22, 1965 47 y.o.  Admit date: 11/25/2012 Discharge date: 11/28/2012  Discharge Diagnoses Patient Active Problem List   Diagnosis Date Noted  . Fall 11/26/2012  . Multiple fractures of ribs of left side 11/26/2012  . Left scapula fracture 11/26/2012  . Fractures of thoracic transverse processes 11/26/2012  . HTN (hypertension) 11/26/2012  . Depression 11/26/2012  . Concussion with loss of consciousness 11/26/2012    Consultants Dr. Allie Bossier for orthopedic surgery   Procedures None   HPI: [name] was on a ladder sawing a limb with a chainsaw. The limb swung back at him and knocked him off the ladder. He fell about 61ft onto his left side. He did lose consciousness. His workup in the ED included CT scans of the head, cervical spine, chest, abdomen, and pelvis and showed the above-mentioned injuries. He was admitted and orthopedic surgery was consulted.   Hospital Course: The patient did very well in terms of his pain control and respiratory function. He was evaluated by physical and occupational therapies and did well also. Orthopedic surgery discussed his shoulder fracture with the traumatic orthopedic specialist and decided that he would need specialized reconstruction by a surgeon at [facility]. This was to be arranged by Dr. Magnus Ivan as an outpatient so we discharged him home in good condition.      Medication List         aspirin EC 81 MG tablet  Take 81 mg by mouth daily.     escitalopram 10 MG tablet  Commonly known as:  LEXAPRO  Take 10 mg by mouth daily.     hydrochlorothiazide 25 MG tablet  Commonly known as:  HYDRODIURIL  Take 25 mg by mouth daily.     methocarbamol 500 MG tablet  Commonly known as:  ROBAXIN  Take 1-2 tablets (500-1,000 mg total) by mouth 4 (four) times daily as needed.     OMEGA 3 PO  Take 3,000 mg by mouth daily.     oxyCODONE-acetaminophen 5-325 MG per  tablet  Commonly known as:  ROXICET  Take 1-2 tablets by mouth every 4 (four) hours as needed for pain.             Follow-up Information   Follow up with Kathryne Hitch, MD In 1 week.   Specialty:  Orthopedic Surgery   Contact information:   [address] [place] [address] [phone]       Call [facility]. (As needed)    Contact information:   [address] [place] [address] [phone]       Signed: Freeman Caldron, PA-C Pager: [phone] General Trauma PA Pager: [phone]  11/28/2012, 1:46 PM

## 2012-11-28 NOTE — Progress Notes (Signed)
Discussed with Dr. Magnus Ivan. He is going to get the patient an appointment at [facility] to set up ORIF scapula. . We will  Plan D/C home today once that is set up as he is otherwise doing well. Patient examined and I agree with the assessment and plan  Violeta Gelinas, MD, MPH, FACS Pager: [phone]  11/28/2012 1:13 PM

## 2012-11-28 NOTE — Discharge Summary (Signed)
Eliazar Olivar, MD, MPH, FACS Pager: [phone]  

## 2012-11-28 NOTE — Progress Notes (Signed)
Patient ID: Daniel Mcpherson, male   DOB: November 24, 1965, 48 y.o.   MRN: [id number] I spoke with Daniel Mcpherson about his left scapula fracture as well as showed him his CT scan.  He understands fully that this is quite a complicated fracture. I talked with Dr. Carola Frost who has recommended a referral to [facility] as an outpatient further further assessment and recommendations.  I have discussed this with Daniel Mcpherson and will work on the referral.  He can be discharged to home today.  I will work on his outpatient follow-up.

## 2012-11-28 NOTE — Progress Notes (Signed)
Patient ID: Daniel Mcpherson, male   DOB: 1965-07-29, 47 y.o.   MRN: [id number]   LOS: 3 days   Subjective: No change.   Objective: Vital signs in last 24 hours: Temp:  [98.9 F (37.2 C)] 98.9 F (37.2 C) (09/30 0506) Pulse Rate:  [98-100] 100 (09/30 0506) Resp:  [18] 18 (09/30 0506) BP: (122-153)/(86-108) 122/92 mmHg (09/30 0631) SpO2:  [94 %-96 %] 94 % (09/30 0506) Last BM Date: 11/25/12   IS: (- )   Physical Exam General appearance: alert and no distress Resp: clear to auscultation bilaterally Cardio: regular rate and rhythm GI: normal findings: bowel sounds normal and soft, non-tender Extremities: NVI   Assessment/Plan: Fall  Concussion -- Supportive care  Mult left rib fxs -- Pulmonary toilet  T-spine TVP fxs  Left scap fx -- NWB, sling. Montez Morita said surgery would be necessary, awaiting formal consult and timing. Multiple medical problems -- Home meds  FEN -- SL IV  VTE -- SCD's, Lovenox  Dispo -- PT/OT, surgery on shoulder    Freeman Caldron, PA-C Pager: [phone] General Trauma PA Pager: [phone]   11/28/2012

## 2012-11-28 NOTE — Progress Notes (Signed)
Went over discharge papers with patient and father.  Patient able to teach back information and verbalizes that he is to receive a phone call from Dr. Magnus Ivan regarding further orthopedic intervention for scapula fx.  Rx given for pain medication.  Patient transported to front entrance by RN to be taken home by father.

## 2012-11-29 HISTORY — PX: SHOULDER SURGERY: SHX246

## 2012-12-08 NOTE — ED Provider Notes (Addendum)
I saw and evaluated the patient, reviewed the resident's note and I agree with the findings and plan.   .Face to face Exam:  General:  Awake HEENT: Abrasion to left parietal region with no active bleeding. Resp:  Normal effort Abd:  Nondistended Neuro:No focal weakness  I reviewed and agree with the EKG interpretation  Nelia Shi, MD 12/18/12 [phone]

## 2013-05-21 ENCOUNTER — Encounter: Payer: Self-pay | Admitting: *Deleted

## 2013-06-18 ENCOUNTER — Encounter (INDEPENDENT_AMBULATORY_CARE_PROVIDER_SITE_OTHER): Payer: Self-pay

## 2013-06-18 ENCOUNTER — Encounter: Payer: Self-pay | Admitting: Gastroenterology

## 2013-06-18 ENCOUNTER — Other Ambulatory Visit: Payer: Self-pay | Admitting: Internal Medicine

## 2013-06-18 ENCOUNTER — Ambulatory Visit (INDEPENDENT_AMBULATORY_CARE_PROVIDER_SITE_OTHER): Payer: 59 | Admitting: Gastroenterology

## 2013-06-18 VITALS — BP 134/80 | HR 96 | Temp 98.2°F | Ht 66.0 in | Wt 157.2 lb

## 2013-06-18 DIAGNOSIS — K625 Hemorrhage of anus and rectum: Secondary | ICD-10-CM

## 2013-06-18 DIAGNOSIS — Z86010 Personal history of colonic polyps: Secondary | ICD-10-CM | POA: Insufficient documentation

## 2013-06-18 NOTE — Progress Notes (Signed)
cc'd to pcp 

## 2013-06-18 NOTE — Assessment & Plan Note (Signed)
48 year old gentleman who presents with complaints of intermittent rectal bleeding and personal history of colon polyps. States he had a colonoscopy about 15 years ago and had 5 polyps removed at [facility]. Was doing well up until last fall when he had to have surgery for traumatic injury to ribs and shoulder blade. He was on narcotics for pain. Developed constipation. He has had some intermittent rectal bleeding but denies any further constipation. No family history of colon cancer. Colonoscopy in the near future.  I have discussed the risks, alternatives, benefits with regards to but not limited to the risk of reaction to medication, bleeding, infection, perforation and the patient is agreeable to proceed. Written consent to be obtained.

## 2013-06-18 NOTE — Progress Notes (Signed)
Primary Care Physician:  Leonides Grills, MD  Primary Gastroenterologist:  Garfield Cornea, MD   Chief Complaint  Patient presents with  . Colonoscopy    HPI:  Daniel Mcpherson is a 48 y.o. male here for consideration of a colonoscopy. His last one was done back about 16 years ago with [facility]. Initially he was seen at that time for rectal bleeding. He had a sigmoidoscopy and subsequently a colonoscopy. Five polyps removed. Couple of years later had a lot of left sided pain, went back to [facility]. 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 Only episode of pancreatitis, did not require hospitalization. No family history of pancreatitis.   About a year ago, started really healthy diet. Was doing very well until he had dramatic injury with fracture of shoulder blade and ribs in September. Require surgery. Long rehabilitation. Fell off diet. So now trying to get back on track.   Has some intermittent rectal bleeding. Notes with certain foods, like popcorn and with heavy lifting. Last for a day or so. BM regular. No abdominal pain. Stools a little more loose since started fenofibrate, zocor, fish oil. Stools now improving. No heartburn, dysphagia.   Current Outpatient Prescriptions  Medication Sig Dispense Refill  . ALPRAZolam (XANAX) 0.5 MG tablet Take 0.5 mg by mouth at bedtime.       Marland Kitchen aspirin EC 81 MG tablet Take 81 mg by mouth daily.      Marland Kitchen escitalopram (LEXAPRO) 10 MG tablet Take 10 mg by mouth daily.      . fenofibrate 160 MG tablet Take 160 mg by mouth daily.       Marland Kitchen lisinopril-hydrochlorothiazide (PRINZIDE,ZESTORETIC) 10-12.5 MG per tablet Take 1 tablet by mouth daily.       . Omega-3 Fatty Acids (OMEGA 3 PO) Take 3,000 mg by mouth daily.      . simvastatin (ZOCOR) 80 MG tablet Take 80 mg by mouth daily at 6 PM.        No current facility-administered medications for this visit.    Allergies as of 06/18/2013  . (No Known Allergies)    Past Medical History   Diagnosis Date  . Hypertension   . Anxiety   . Hyperlipidemia   . Colon polyps   . Pancreatitis     hypertriglyceridema, single episode did not require hospitalization, [facility]. Around 2000.    Past Surgical History  Procedure Laterality Date  . Knee surgery      left  . Shoulder surgery  11/2012    left  . Femoral hernia repair      Family History  Problem Relation Age of Onset  . Colon cancer Neg Hx   . Pancreatitis Neg Hx   . Hypercholesterolemia Mother     History   Social History  . Marital Status: Single    Spouse Name: N/A    Number of Children: N/A  . Years of Education: N/A   Occupational History  . [employer]    Social History Main Topics  . Smoking status: Current Every Day Smoker -- 0.50 packs/day    Types: Cigarettes  . Smokeless tobacco: Not on file  . Alcohol Use: No  . Drug Use: No  . Sexual Activity: Not on file   Other Topics Concern  . Not on file   Social History Narrative  . No narrative on file      ROS:  General: Negative for anorexia, weight loss, fever, chills, fatigue, weakness. Eyes:  Negative for vision changes.  ENT: Negative for hoarseness, difficulty swallowing , nasal congestion. CV: Negative for chest pain, angina, palpitations, dyspnea on exertion, peripheral edema.  Respiratory: Negative for dyspnea at rest, dyspnea on exertion, cough, sputum, wheezing.  GI: See history of present illness. GU:  Negative for dysuria, hematuria, urinary incontinence, urinary frequency, nocturnal urination.  MS: Negative for joint pain, low back pain.  Derm: Negative for rash or itching.  Neuro: Negative for weakness, abnormal sensation, seizure, frequent headaches, memory loss, confusion.  Psych: Negative for anxiety, depression, suicidal ideation, hallucinations.  Endo: Negative for unusual weight change.  Heme: Negative for bruising or bleeding. Allergy: Negative for rash or hives.    Physical  Examination:  BP 134/80  Pulse 96  Temp(Src) 98.2 F (36.8 C) (Oral)  Ht 5\' 6"  (1.676 m)  Wt 157 lb 3.2 oz (71.305 kg)  BMI 25.38 kg/m2   General: Well-nourished, well-developed in no acute distress.  Head: Normocephalic, atraumatic.   Eyes: Conjunctiva pink, no icterus. Mouth: Oropharyngeal mucosa moist and pink , no lesions erythema or exudate. Neck: Supple without thyromegaly, masses, or lymphadenopathy.  Lungs: Clear to auscultation bilaterally.  Heart: Regular rate and rhythm, no murmurs rubs or gallops.  Abdomen: Bowel sounds are normal, nontender, nondistended, no hepatosplenomegaly or masses, no abdominal bruits or    hernia , no rebound or guarding.   Rectal: Deferred Extremities: No lower extremity edema. No clubbing or deformities.  Neuro: Alert and oriented x 4 , grossly normal neurologically.  Skin: Warm and dry, no rash or jaundice.   Psych: Alert and cooperative, normal mood and affect.

## 2013-06-18 NOTE — Patient Instructions (Signed)
1. Colonoscopy with Dr. Rourk as scheduled. See separate instructions.  

## 2013-06-19 ENCOUNTER — Other Ambulatory Visit: Payer: Self-pay | Admitting: Internal Medicine

## 2013-06-19 MED ORDER — PEG 3350-KCL-NA BICARB-NACL 420 G PO SOLR: 4000.0000 mL | ORAL | Status: DC

## 2013-06-21 ENCOUNTER — Encounter (HOSPITAL_COMMUNITY): Payer: Self-pay | Admitting: Pharmacy Technician

## 2013-07-04 ENCOUNTER — Encounter (HOSPITAL_COMMUNITY)
Admission: RE | Disposition: A | Discharge status: Home / Self Care | Payer: Self-pay | Source: Ambulatory Visit | Attending: Internal Medicine

## 2013-07-04 ENCOUNTER — Encounter (HOSPITAL_COMMUNITY): Payer: Self-pay

## 2013-07-04 ENCOUNTER — Ambulatory Visit (HOSPITAL_COMMUNITY)
Admission: RE | Admit: 2013-07-04 | Discharge: 2013-07-04 | Disposition: A | Discharge status: Home / Self Care | Payer: 59 | Source: Ambulatory Visit | Attending: Internal Medicine | Admitting: Internal Medicine

## 2013-07-04 DIAGNOSIS — D126 Benign neoplasm of colon, unspecified: Secondary | ICD-10-CM | POA: Insufficient documentation

## 2013-07-04 DIAGNOSIS — E785 Hyperlipidemia, unspecified: Secondary | ICD-10-CM | POA: Insufficient documentation

## 2013-07-04 DIAGNOSIS — K625 Hemorrhage of anus and rectum: Secondary | ICD-10-CM | POA: Insufficient documentation

## 2013-07-04 DIAGNOSIS — K5730 Diverticulosis of large intestine without perforation or abscess without bleeding: Secondary | ICD-10-CM | POA: Insufficient documentation

## 2013-07-04 DIAGNOSIS — F17200 Nicotine dependence, unspecified, uncomplicated: Secondary | ICD-10-CM | POA: Insufficient documentation

## 2013-07-04 DIAGNOSIS — D129 Benign neoplasm of anus and anal canal: Secondary | ICD-10-CM

## 2013-07-04 DIAGNOSIS — Z7982 Long term (current) use of aspirin: Secondary | ICD-10-CM | POA: Insufficient documentation

## 2013-07-04 DIAGNOSIS — Z860100 Personal history of colon polyps, unspecified: Secondary | ICD-10-CM | POA: Insufficient documentation

## 2013-07-04 DIAGNOSIS — I10 Essential (primary) hypertension: Secondary | ICD-10-CM | POA: Insufficient documentation

## 2013-07-04 DIAGNOSIS — D128 Benign neoplasm of rectum: Secondary | ICD-10-CM

## 2013-07-04 DIAGNOSIS — Z79899 Other long term (current) drug therapy: Secondary | ICD-10-CM | POA: Insufficient documentation

## 2013-07-04 HISTORY — PX: COLONOSCOPY: SHX5424

## 2013-07-04 SURGERY — COLONOSCOPY
Anesthesia: Moderate Sedation

## 2013-07-04 MED ORDER — MEPERIDINE HCL 100 MG/ML IJ SOLN
INTRAMUSCULAR | Status: AC
  Filled 2013-07-04: qty 2

## 2013-07-04 MED ORDER — SODIUM CHLORIDE 0.9 % IV SOLN
INTRAVENOUS | Status: DC
  Administered 2013-07-04: 08:00:00 via INTRAVENOUS

## 2013-07-04 MED ORDER — ONDANSETRON HCL 4 MG/2ML IJ SOLN
INTRAMUSCULAR | Status: AC
  Filled 2013-07-04: qty 2

## 2013-07-04 MED ORDER — MIDAZOLAM HCL 5 MG/5ML IJ SOLN
INTRAMUSCULAR | Status: DC | PRN
  Administered 2013-07-04 (×4): 2 mg via INTRAVENOUS

## 2013-07-04 MED ORDER — ONDANSETRON HCL 4 MG/2ML IJ SOLN
INTRAMUSCULAR | Status: DC | PRN
  Administered 2013-07-04: 4 mg via INTRAVENOUS

## 2013-07-04 MED ORDER — MIDAZOLAM HCL 5 MG/5ML IJ SOLN
INTRAMUSCULAR | Status: AC
  Filled 2013-07-04: qty 10

## 2013-07-04 MED ORDER — MEPERIDINE HCL 100 MG/ML IJ SOLN
INTRAMUSCULAR | Status: DC | PRN
  Administered 2013-07-04 (×2): 50 mg via INTRAVENOUS
  Administered 2013-07-04 (×2): 25 mg via INTRAVENOUS

## 2013-07-04 MED ORDER — STERILE WATER FOR IRRIGATION IR SOLN
Status: DC | PRN
  Administered 2013-07-04: 09:00:00

## 2013-07-04 NOTE — Interval H&P Note (Signed)
History and Physical Interval Note:  07/04/2013 9:02 AM  Daniel Mcpherson  has presented today for surgery, with the diagnosis of HISTORY OF COLON POLYPS , RECTAL BLEEDING  The various methods of treatment have been discussed with the patient and family. After consideration of risks, benefits and other options for treatment, the patient has consented to  Procedure(s) with comments: COLONOSCOPY (N/A) - 8:30 as a surgical intervention .  The patient's history has been reviewed, patient examined, no change in status, stable for surgery.  I have reviewed the patient's chart and labs.  Questions were answered to the patient's satisfaction.     [name] Grey Schlauch   No change. Colonoscopy per plan.The risks, benefits, limitations, alternatives and imponderables have been reviewed with the patient. Questions have been answered. All parties are agreeable.

## 2013-07-04 NOTE — Discharge Instructions (Addendum)
Colonoscopy Discharge Instructions  Read the instructions outlined below and refer to this sheet in the next few weeks. These discharge instructions provide you with general information on caring for yourself after you leave the hospital. Your doctor may also give you specific instructions. While your treatment has been planned according to the most current medical practices available, unavoidable complications occasionally occur. If you have any problems or questions after discharge, call Dr. Gala Romney at [phone]. ACTIVITY  You may resume your regular activity, but move at a slower pace for the next 24 hours.   Take frequent rest periods for the next 24 hours.   Walking will help get rid of the air and reduce the bloated feeling in your belly (abdomen).   No driving for 24 hours (because of the medicine (anesthesia) used during the test).    Do not sign any important legal documents or operate any machinery for 24 hours (because of the anesthesia used during the test).  NUTRITION  Drink plenty of fluids.   You may resume your normal diet as instructed by your doctor.   Begin with a light meal and progress to your normal diet. Heavy or fried foods are harder to digest and may make you feel sick to your stomach (nauseated).   Avoid alcoholic beverages for 24 hours or as instructed.  MEDICATIONS  You may resume your normal medications unless your doctor tells you otherwise.  WHAT YOU CAN EXPECT TODAY  Some feelings of bloating in the abdomen.   Passage of more gas than usual.   Spotting of blood in your stool or on the toilet paper.  IF YOU HAD POLYPS REMOVED DURING THE COLONOSCOPY:  No aspirin products for 7 days or as instructed.   No alcohol for 7 days or as instructed.   Eat a soft diet for the next 24 hours.  FINDING OUT THE RESULTS OF YOUR TEST Not all test results are available during your visit. If your test results are not back during the visit, make an appointment  with your caregiver to find out the results. Do not assume everything is normal if you have not heard from your caregiver or the medical facility. It is important for you to follow up on all of your test results.  SEEK IMMEDIATE MEDICAL ATTENTION IF:  You have more than a spotting of blood in your stool.   Your belly is swollen (abdominal distention).   You are nauseated or vomiting.   You have a temperature over 101.   You have abdominal pain or discomfort that is severe or gets worse throughout the day.     Polyp and diverticulosis information provided  Further recommendations to follow pending review of path pathology report   Diverticulosis Diverticulosis is a common condition that develops when small pouches (diverticula) form in the wall of the colon. The risk of diverticulosis increases with age. It happens more often in people who eat a low-fiber diet. Most individuals with diverticulosis have no symptoms. Those individuals with symptoms usually experience abdominal pain, constipation, or loose stools (diarrhea). HOME CARE INSTRUCTIONS  Increase the amount of fiber in your diet as directed by your caregiver or dietician. This may reduce symptoms of diverticulosis. Your caregiver may recommend taking a dietary fiber supplement. Drink at least 6 to 8 glasses of water each day to prevent constipation. Try not to strain when you have a bowel movement. Your caregiver may recommend avoiding nuts and seeds to prevent complications, although this is still an  uncertain benefit. Only take over-the-counter or prescription medicines for pain, discomfort, or fever as directed by your caregiver. FOODS WITH HIGH FIBER CONTENT INCLUDE: Fruits. Apple, peach, pear, tangerine, raisins, prunes. Vegetables. Brussels sprouts, asparagus, broccoli, cabbage, carrot, cauliflower, romaine lettuce, spinach, summer squash, tomato, winter squash, zucchini. Starchy Vegetables. Baked beans, kidney beans,  lima beans, split peas, lentils, potatoes (with skin). Grains. Whole wheat bread, brown rice, bran flake cereal, plain oatmeal, white rice, shredded wheat, bran muffins. SEEK IMMEDIATE MEDICAL CARE IF:  You develop increasing pain or severe bloating. You have an oral temperature above 102 F (38.9 C), not controlled by medicine. You develop vomiting or bowel movements that are bloody or black. Document Released: 11/13/2003 Document Revised: 05/10/2011 Document Reviewed: 07/16/2009 [facility] Patient Information 2014 [employer]. Colon Polyps Polyps are lumps of extra tissue growing inside the body. Polyps can grow in the large intestine (colon). Most colon polyps are noncancerous (benign). However, some colon polyps can become cancerous over time. Polyps that are larger than a pea may be harmful. To be safe, caregivers remove and test all polyps. CAUSES  Polyps form when mutations in the genes cause your cells to grow and divide even though no more tissue is needed. RISK FACTORS There are a number of risk factors that can increase your chances of getting colon polyps. They include: Being older than 50 years. Family history of colon polyps or colon cancer. Long-term colon diseases, such as colitis or Crohn disease. Being overweight. Smoking. Being inactive. Drinking too much alcohol. SYMPTOMS  Most small polyps do not cause symptoms. If symptoms are present, they may include: Blood in the stool. The stool may look dark red or black. Constipation or diarrhea that lasts longer than 1 week. DIAGNOSIS People often do not know they have polyps until their caregiver finds them during a regular checkup. Your caregiver can use 4 tests to check for polyps: Digital rectal exam. The caregiver wears gloves and feels inside the rectum. This test would find polyps only in the rectum. Barium enema. The caregiver puts a liquid called barium into your rectum before taking X-rays of your colon. Barium  makes your colon look white. Polyps are dark, so they are easy to see in the X-ray pictures. Sigmoidoscopy. A thin, flexible tube (sigmoidoscope) is placed into your rectum. The sigmoidoscope has a light and tiny camera in it. The caregiver uses the sigmoidoscope to look at the last third of your colon. Colonoscopy. This test is like sigmoidoscopy, but the caregiver looks at the entire colon. This is the most common method for finding and removing polyps. TREATMENT  Any polyps will be removed during a sigmoidoscopy or colonoscopy. The polyps are then tested for cancer. PREVENTION  To help lower your risk of getting more colon polyps: Eat plenty of fruits and vegetables. Avoid eating fatty foods. Do not smoke. Avoid drinking alcohol. Exercise every day. Lose weight if recommended by your caregiver. Eat plenty of calcium and folate. Foods that are rich in calcium include milk, cheese, and broccoli. Foods that are rich in folate include chickpeas, kidney beans, and spinach. HOME CARE INSTRUCTIONS Keep all follow-up appointments as directed by your caregiver. You may need periodic exams to check for polyps. SEEK MEDICAL CARE IF: You notice bleeding during a bowel movement. Document Released: 11/12/2003 Document Revised: 05/10/2011 Document Reviewed: 04/27/2011 [facility] Patient Information 2014 [place].

## 2013-07-04 NOTE — Op Note (Signed)
[facility] [address] [place], [address]   COLONOSCOPY PROCEDURE REPORT  PATIENT: Daniel Mcpherson, Daniel Mcpherson  MR#:         [id number] BIRTHDATE: 04-Jan-1966 , 48  yrs. old GENDER: Male ENDOSCOPIST: R.  Garfield Cornea, MD FACP FACG REFERRED BY:  Elsie Lincoln, M.D. PROCEDURE DATE:  07/04/2013 PROCEDURE:     Colonoscopy with multiple snare polypectomies and polypectomy site tattooing  INDICATIONS: hematochezia; distant history of colonic polyps  INFORMED CONSENT:  The risks, benefits, alternatives and imponderables including but not limited to bleeding, perforation as well as the possibility of a missed lesion have been reviewed.  The potential for biopsy, lesion removal, etc. have also been discussed.  Questions have been answered.  All parties agreeable. Please see the history and physical in the medical record for more information.  MEDICATIONS: Versed 8 mg IV and Demerol 50 mg IV in divided doses. Zofran 4 mg IV.  DESCRIPTION OF PROCEDURE:  After a digital rectal exam was performed, the EC-[id number] ([id number])  colonoscope was advanced from the anus through the rectum and colon to the area of the cecum, ileocecal valve and appendiceal orifice.  The cecum was deeply intubated.  These structures were well-seen and photographed for the record.  From the level of the cecum and ileocecal valve, the scope was slowly and cautiously withdrawn.  The mucosal surfaces were carefully surveyed utilizing scope tip deflection to facilitate fold flattening as needed.  The scope was pulled down into the rectum where a thorough examination including retroflexion was performed.    FINDINGS:  Adequate preparation. Rectum abnormal. The patient had (1) 1.25 cm pedunculated polyp at the anal verge; the patient had (1) 1.5 cm sessile polyp on fold at 6 cm the anal verge. The patient also had (1) 1 cm pedunculated polyp at 12 cm from the anal verge. There were a couple of diminutive polyps  in between these larger polyps. The patient had (1) 1 cm pedunculated polyp at the splenic flexure.  Patient had scattered left-sided diverticula; the remainder of the colonic mucosa appeared normal.  THERAPEUTIC / DIAGNOSTIC MANEUVERS PERFORMED:  The splenic flexure and  (3) larger rectal polyps were resected cleanly the hot snare cautery. The large polypectomy sites in the rectum were tattooed at the anal verge, 6 cm and 12 cm from the anal verge.  The diminutive rectal polyps were removed with cold biopsy technique. COMPLICATIONS: None  CECAL WITHDRAWAL TIME:  38 minutes  IMPRESSION:  Multiple rectal and colonic polyps-removed as described above. Colonic diverticulosis.  RECOMMENDATIONS: Followup on pathology. Assuming no high-grade pathology, patient will need a repeat colonoscopy in 3-6 months.   _______________________________ eSigned:  R. Garfield Cornea, MD FACP [facility] 07/04/2013 10:20 AM   CC:    PATIENT NAME:  Daniel Mcpherson, Daniel Mcpherson MR#: [id number]

## 2013-07-04 NOTE — H&P (View-Only) (Signed)
Primary Care Physician:  Leonides Grills, MD  Primary Gastroenterologist:  Garfield Cornea, MD   Chief Complaint  Patient presents with  . Colonoscopy    HPI:  Daniel Mcpherson is a 48 y.o. male here for consideration of a colonoscopy. His last one was done back about 16 years ago with [facility]. Initially he was seen at that time for rectal bleeding. He had a sigmoidoscopy and subsequently a colonoscopy. Five polyps removed. Couple of years later had a lot of left sided pain, went back to [facility]. 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 Only episode of pancreatitis, did not require hospitalization. No family history of pancreatitis.   About a year ago, started really healthy diet. Was doing very well until he had dramatic injury with fracture of shoulder blade and ribs in September. Require surgery. Long rehabilitation. Fell off diet. So now trying to get back on track.   Has some intermittent rectal bleeding. Notes with certain foods, like popcorn and with heavy lifting. Last for a day or so. BM regular. No abdominal pain. Stools a little more loose since started fenofibrate, zocor, fish oil. Stools now improving. No heartburn, dysphagia.   Current Outpatient Prescriptions  Medication Sig Dispense Refill  . ALPRAZolam (XANAX) 0.5 MG tablet Take 0.5 mg by mouth at bedtime.       Marland Kitchen aspirin EC 81 MG tablet Take 81 mg by mouth daily.      Marland Kitchen escitalopram (LEXAPRO) 10 MG tablet Take 10 mg by mouth daily.      . fenofibrate 160 MG tablet Take 160 mg by mouth daily.       Marland Kitchen lisinopril-hydrochlorothiazide (PRINZIDE,ZESTORETIC) 10-12.5 MG per tablet Take 1 tablet by mouth daily.       . Omega-3 Fatty Acids (OMEGA 3 PO) Take 3,000 mg by mouth daily.      . simvastatin (ZOCOR) 80 MG tablet Take 80 mg by mouth daily at 6 PM.        No current facility-administered medications for this visit.    Allergies as of 06/18/2013  . (No Known Allergies)    Past Medical History   Diagnosis Date  . Hypertension   . Anxiety   . Hyperlipidemia   . Colon polyps   . Pancreatitis     hypertriglyceridema, single episode did not require hospitalization, [facility]. Around 2000.    Past Surgical History  Procedure Laterality Date  . Knee surgery      left  . Shoulder surgery  11/2012    left  . Femoral hernia repair      Family History  Problem Relation Age of Onset  . Colon cancer Neg Hx   . Pancreatitis Neg Hx   . Hypercholesterolemia Mother     History   Social History  . Marital Status: Single    Spouse Name: N/A    Number of Children: N/A  . Years of Education: N/A   Occupational History  . [employer]    Social History Main Topics  . Smoking status: Current Every Day Smoker -- 0.50 packs/day    Types: Cigarettes  . Smokeless tobacco: Not on file  . Alcohol Use: No  . Drug Use: No  . Sexual Activity: Not on file   Other Topics Concern  . Not on file   Social History Narrative  . No narrative on file      ROS:  General: Negative for anorexia, weight loss, fever, chills, fatigue, weakness. Eyes:  Negative for vision changes.  ENT: Negative for hoarseness, difficulty swallowing , nasal congestion. CV: Negative for chest pain, angina, palpitations, dyspnea on exertion, peripheral edema.  Respiratory: Negative for dyspnea at rest, dyspnea on exertion, cough, sputum, wheezing.  GI: See history of present illness. GU:  Negative for dysuria, hematuria, urinary incontinence, urinary frequency, nocturnal urination.  MS: Negative for joint pain, low back pain.  Derm: Negative for rash or itching.  Neuro: Negative for weakness, abnormal sensation, seizure, frequent headaches, memory loss, confusion.  Psych: Negative for anxiety, depression, suicidal ideation, hallucinations.  Endo: Negative for unusual weight change.  Heme: Negative for bruising or bleeding. Allergy: Negative for rash or hives.    Physical  Examination:  BP 134/80  Pulse 96  Temp(Src) 98.2 F (36.8 C) (Oral)  Ht 5\' 6"  (1.676 m)  Wt 157 lb 3.2 oz (71.305 kg)  BMI 25.38 kg/m2   General: Well-nourished, well-developed in no acute distress.  Head: Normocephalic, atraumatic.   Eyes: Conjunctiva pink, no icterus. Mouth: Oropharyngeal mucosa moist and pink , no lesions erythema or exudate. Neck: Supple without thyromegaly, masses, or lymphadenopathy.  Lungs: Clear to auscultation bilaterally.  Heart: Regular rate and rhythm, no murmurs rubs or gallops.  Abdomen: Bowel sounds are normal, nontender, nondistended, no hepatosplenomegaly or masses, no abdominal bruits or    hernia , no rebound or guarding.   Rectal: Deferred Extremities: No lower extremity edema. No clubbing or deformities.  Neuro: Alert and oriented x 4 , grossly normal neurologically.  Skin: Warm and dry, no rash or jaundice.   Psych: Alert and cooperative, normal mood and affect.

## 2013-07-07 ENCOUNTER — Encounter: Payer: Self-pay | Admitting: Internal Medicine

## 2013-07-10 ENCOUNTER — Encounter (HOSPITAL_COMMUNITY): Payer: Self-pay | Admitting: Internal Medicine

## 2013-07-12 ENCOUNTER — Encounter: Payer: Self-pay | Admitting: Internal Medicine

## 2013-08-10 ENCOUNTER — Ambulatory Visit: Payer: 59 | Admitting: Internal Medicine

## 2013-08-14 ENCOUNTER — Ambulatory Visit (INDEPENDENT_AMBULATORY_CARE_PROVIDER_SITE_OTHER): Payer: 59 | Admitting: Internal Medicine

## 2013-08-14 ENCOUNTER — Encounter: Payer: Self-pay | Admitting: Internal Medicine

## 2013-08-14 ENCOUNTER — Encounter (INDEPENDENT_AMBULATORY_CARE_PROVIDER_SITE_OTHER): Payer: Self-pay

## 2013-08-14 VITALS — BP 112/73 | HR 96 | Temp 98.9°F | Resp 20 | Ht 66.0 in | Wt 153.0 lb

## 2013-08-14 DIAGNOSIS — Z86010 Personal history of colonic polyps: Secondary | ICD-10-CM

## 2013-08-14 NOTE — Patient Instructions (Signed)
Will schedule a colonoscopy to follow-up on polyps in August   We will call you to schedule next month

## 2013-08-14 NOTE — Progress Notes (Signed)
Primary Care Physician:  Leonides Grills, MD Primary Gastroenterologist:  Dr. Gala Romney  Pre-Procedure History & Physical: HPI:  Daniel Mcpherson is a 48 y.o. male here for followup. Colonoscopy in early May of this year demonstrated multiple, predominantly rectal, and colon polyps. 2 were greater than a centimeter. The larger rectal polyps were felt to be completely removed with hot snare. However, given the clustering of the rectal polyps in their size, which recommended that he return in 3-6 months for surveillance examination. He has not had any further rectal bleeding. [facility].  Past Medical History  Diagnosis Date  . Hypertension   . Anxiety   . Hyperlipidemia   . Colon polyps   . Pancreatitis     hypertriglyceridema, single episode did not require hospitalization, [facility]. Around 2000.  . Tubular adenoma   . Diverticulosis     Past Surgical History  Procedure Laterality Date  . Knee surgery      left  . Shoulder surgery  11/2012    left  . Femoral hernia repair    . Colonoscopy N/A 07/04/2013    Dr. Gala Romney- multiple rectal and colonic polyos removed. colonic dierticulosis, tubular adenoma on bx.    Prior to Admission medications   Medication Sig Start Date End Date Taking? Authorizing Provider  ALPRAZolam Duanne Moron) 0.5 MG tablet Take 0.5 mg by mouth at bedtime as needed for sleep.  05/04/13  Yes Historical Provider, MD  aspirin EC 81 MG tablet Take 81 mg by mouth daily.   Yes Historical Provider, MD  escitalopram (LEXAPRO) 10 MG tablet Take 10 mg by mouth daily. 11/19/12  Yes Historical Provider, MD  fenofibrate 160 MG tablet Take 160 mg by mouth daily.  05/15/13  Yes Historical Provider, MD  Glucosamine Sulfate 500 MG TABS Take 500 mg by mouth daily.   Yes Historical Provider, MD  hydrochlorothiazide (HYDRODIURIL) 25 MG tablet Take 25 mg by mouth daily.   Yes Historical Provider, MD  lisinopril-hydrochlorothiazide (PRINZIDE,ZESTORETIC) 10-12.5 MG per tablet Take 1 tablet  by mouth daily.  05/14/13  Yes Historical Provider, MD  Omega-3 1000 MG CAPS Take 1 g by mouth daily.   Yes Historical Provider, MD  Omega-3 Fatty Acids (OMEGA 3 PO) Take 3,000 mg by mouth daily.   Yes Historical Provider, MD  polyethylene glycol-electrolytes (TRILYTE) 420 G solution Take 4,000 mLs by mouth as directed. 06/19/13  Yes Daneil Dolin, MD  simvastatin (ZOCOR) 80 MG tablet Take 80 mg by mouth daily at 6 PM.  05/15/13  Yes Historical Provider, MD    Allergies as of 08/14/2013  . (No Known Allergies)    Family History  Problem Relation Age of Onset  . Colon cancer Neg Hx   . Pancreatitis Neg Hx   . Hypercholesterolemia Mother     History   Social History  . Marital Status: Single    Spouse Name: N/A    Number of Children: N/A  . Years of Education: N/A   Occupational History  . [employer]    Social History Main Topics  . Smoking status: Current Every Day Smoker -- 0.50 packs/day    Types: Cigarettes  . Smokeless tobacco: Not on file  . Alcohol Use: No  . Drug Use: No  . Sexual Activity: Not on file   Other Topics Concern  . Not on file   Social History Narrative  . No narrative on file    Review of Systems: See HPI, otherwise  negative ROS  Physical Exam: BP 112/73  Pulse 96  Temp(Src) 98.9 F (37.2 C) (Oral)  Resp 20  Ht 5\' 6"  (1.676 m)  Wt 153 lb (69.4 kg)  BMI 24.71 kg/m2 General:   Alert,  Well-developed, well-nourished, pleasant and cooperative in NAD Skin:  Intact without significant lesions or rashes. Eyes:  Sclera clear, no icterus.   Conjunctiva pink. Ears:  Normal auditory acuity. Nose:  No deformity, discharge,  or lesions. Mouth:  No deformity or lesions. Neck:  Supple; no masses or thyromegaly. No significant cervical adenopathy. Lungs:  Clear throughout to auscultation.   No wheezes, crackles, or rhonchi. No acute distress. Heart:  Regular rate and rhythm; no murmurs, clicks, rubs,  or gallops. Abdomen:  Non-distended, normal bowel sounds.  Soft and nontender without appreciable mass or hepatosplenomegaly.  Pulses:  Normal pulses noted. Extremities:  Without clubbing or edema.  Impression   48 year old gentleman with multiple colonic and rectal polyps. He had multiple large polyps in the rectum removed in May. He's done well. Given the multiplicity and the the size of the rectal polyps, it was recommended that he return in 3-6 months for followup colonoscopy.  Recommendations:   Plan for surveillance colonoscopy in August of this year to reassess his rectum and colon.The risks, benefits, limitations, alternatives and imponderables have been reviewed with the patient. Questions have been answered. All parties are agreeable.    Notice: This dictation was prepared with Dragon dictation along with smaller phrase technology. Any transcriptional errors that result from this process are unintentional and may not be corrected upon review.

## 2013-09-12 ENCOUNTER — Other Ambulatory Visit: Payer: Self-pay

## 2013-09-12 ENCOUNTER — Telehealth: Payer: Self-pay | Admitting: *Deleted

## 2013-09-12 DIAGNOSIS — Z1211 Encounter for screening for malignant neoplasm of colon: Secondary | ICD-10-CM

## 2013-09-12 NOTE — Telephone Encounter (Signed)
Pt came into the office wanting to be triaged for his colonoscopy, I made him aware that Tamela Oddi is in a room with a patient, patient stated it would be okay if Tamela Oddi called him back, pt needs a colonoscopy in august pt may be changing jobs and he would like to schedule it for the first week of august if he can. Please call pt at [phone]

## 2013-09-12 NOTE — Telephone Encounter (Addendum)
Gastroenterology Pre-Procedure Review  Request Date: 09/12/2013 Requesting Physician:   PATIENT REVIEW QUESTIONS: The patient responded to the following health history questions as indicated:    1. Diabetes Melitis: no 2. Joint replacements in the past 12 months: no 3. Major health problems in the past 3 months: no 4. Has an artificial valve or MVP: no 5. Has a defibrillator: no 6. Has been advised in past to take antibiotics in advance of a procedure like teeth cleaning: no    MEDICATIONS & ALLERGIES:    Patient reports the following regarding taking any blood thinners:   Plavix? no Aspirin? YES Coumadin? no  Patient confirms/reports the following medications:  Current Outpatient Prescriptions  Medication Sig Dispense Refill  . ALPRAZolam (XANAX) 0.5 MG tablet Take 0.5 mg by mouth at bedtime as needed for sleep.       Marland Kitchen aspirin EC 81 MG tablet Take 81 mg by mouth daily.      Marland Kitchen CINNAMON PO Take by mouth.      . escitalopram (LEXAPRO) 10 MG tablet Take 10 mg by mouth daily.      . fenofibrate 160 MG tablet Take 160 mg by mouth daily.       . Glucosamine Sulfate 500 MG TABS Take 500 mg by mouth daily.      Marland Kitchen lisinopril-hydrochlorothiazide (PRINZIDE,ZESTORETIC) 10-12.5 MG per tablet Take 1 tablet by mouth daily.       . NON FORMULARY Probiotic   One tablet daily      . Omega-3 1000 MG CAPS Take 1 g by mouth daily.      . simvastatin (ZOCOR) 80 MG tablet Take 80 mg by mouth daily at 6 PM.       . polyethylene glycol-electrolytes (TRILYTE) 420 G solution Take 4,000 mLs by mouth as directed.  4000 mL  0   No current facility-administered medications for this visit.    Patient confirms/reports the following allergies:  No Known Allergies  No orders of the defined types were placed in this encounter.    AUTHORIZATION INFORMATION Primary Insurance:  ID #:   Group #:  Pre-Cert / Auth required Pre-Cert / Auth #:   Secondary Insurance:   ID #:   Group #:  Pre-Cert / Auth  required:  Pre-Cert / Auth #:   SCHEDULE INFORMATION: Procedure has been scheduled as follows:  Date: 10/04/2013               Time:  10:30 AM Location: [facility] Short Stay  This Gastroenterology Pre-Precedure Review Form is being routed to the following provider(s): R. Garfield Cornea, MD

## 2013-09-12 NOTE — Telephone Encounter (Signed)
Tried to call pt and could not leave a VM.  

## 2013-09-13 NOTE — Telephone Encounter (Signed)
Ok to schedule.

## 2013-09-13 NOTE — Addendum Note (Signed)
Addended by: Mahala Menghini on: 09/13/2013 12:05 PM   Modules accepted: Orders

## 2013-09-18 ENCOUNTER — Telehealth: Payer: Self-pay

## 2013-09-18 MED ORDER — PEG-KCL-NACL-NASULF-NA ASC-C 100 G PO SOLR: 1.0000 | ORAL | Status: DC

## 2013-09-18 NOTE — Telephone Encounter (Signed)
I called [employer] at [phone] and spoke to [place] L who said that a PA is not required for screening colonoscopy.

## 2013-09-18 NOTE — Telephone Encounter (Signed)
Rx sent to the pharmacy and instructions mailed to pt.  

## 2013-09-18 NOTE — Addendum Note (Signed)
Addended by: Everardo All on: 09/18/2013 07:59 AM   Modules accepted: Orders

## 2013-09-20 ENCOUNTER — Encounter (HOSPITAL_COMMUNITY): Payer: Self-pay | Admitting: Pharmacy Technician

## 2013-10-04 ENCOUNTER — Encounter (HOSPITAL_COMMUNITY): Payer: Self-pay

## 2013-10-04 ENCOUNTER — Ambulatory Visit (HOSPITAL_COMMUNITY)
Admission: RE | Admit: 2013-10-04 | Discharge: 2013-10-04 | Disposition: A | Discharge status: Home / Self Care | Payer: 59 | Source: Ambulatory Visit | Attending: Internal Medicine | Admitting: Internal Medicine

## 2013-10-04 ENCOUNTER — Encounter (HOSPITAL_COMMUNITY)
Admission: RE | Disposition: A | Discharge status: Home / Self Care | Payer: Self-pay | Source: Ambulatory Visit | Attending: Internal Medicine

## 2013-10-04 DIAGNOSIS — F411 Generalized anxiety disorder: Secondary | ICD-10-CM | POA: Insufficient documentation

## 2013-10-04 DIAGNOSIS — Z7982 Long term (current) use of aspirin: Secondary | ICD-10-CM | POA: Diagnosis not present

## 2013-10-04 DIAGNOSIS — E785 Hyperlipidemia, unspecified: Secondary | ICD-10-CM | POA: Insufficient documentation

## 2013-10-04 DIAGNOSIS — Z79899 Other long term (current) drug therapy: Secondary | ICD-10-CM | POA: Insufficient documentation

## 2013-10-04 DIAGNOSIS — F17200 Nicotine dependence, unspecified, uncomplicated: Secondary | ICD-10-CM | POA: Insufficient documentation

## 2013-10-04 DIAGNOSIS — I10 Essential (primary) hypertension: Secondary | ICD-10-CM | POA: Diagnosis not present

## 2013-10-04 DIAGNOSIS — D126 Benign neoplasm of colon, unspecified: Secondary | ICD-10-CM | POA: Insufficient documentation

## 2013-10-04 DIAGNOSIS — K5730 Diverticulosis of large intestine without perforation or abscess without bleeding: Secondary | ICD-10-CM | POA: Diagnosis not present

## 2013-10-04 DIAGNOSIS — Z1211 Encounter for screening for malignant neoplasm of colon: Secondary | ICD-10-CM | POA: Diagnosis not present

## 2013-10-04 DIAGNOSIS — Z86010 Personal history of colonic polyps: Secondary | ICD-10-CM

## 2013-10-04 HISTORY — PX: COLONOSCOPY: SHX5424

## 2013-10-04 SURGERY — COLONOSCOPY
Anesthesia: Moderate Sedation

## 2013-10-04 MED ORDER — STERILE WATER FOR IRRIGATION IR SOLN
Status: DC | PRN
  Administered 2013-10-04: 10:00:00

## 2013-10-04 MED ORDER — SODIUM CHLORIDE 0.9 % IV SOLN: INTRAVENOUS | Status: DC

## 2013-10-04 MED ORDER — MIDAZOLAM HCL 5 MG/5ML IJ SOLN
INTRAMUSCULAR | Status: AC
  Filled 2013-10-04: qty 10

## 2013-10-04 MED ORDER — ONDANSETRON HCL 4 MG/2ML IJ SOLN
INTRAMUSCULAR | Status: AC
  Filled 2013-10-04: qty 2

## 2013-10-04 MED ORDER — MEPERIDINE HCL 100 MG/ML IJ SOLN
INTRAMUSCULAR | Status: DC | PRN
  Administered 2013-10-04: 25 mg via INTRAVENOUS
  Administered 2013-10-04 (×2): 50 mg via INTRAVENOUS

## 2013-10-04 MED ORDER — ONDANSETRON HCL 4 MG/2ML IJ SOLN
INTRAMUSCULAR | Status: DC | PRN
  Administered 2013-10-04: 4 mg via INTRAVENOUS

## 2013-10-04 MED ORDER — MEPERIDINE HCL 100 MG/ML IJ SOLN
INTRAMUSCULAR | Status: AC
  Filled 2013-10-04: qty 2

## 2013-10-04 MED ORDER — MIDAZOLAM HCL 5 MG/5ML IJ SOLN
INTRAMUSCULAR | Status: DC | PRN
  Administered 2013-10-04: 1 mg via INTRAVENOUS
  Administered 2013-10-04 (×3): 2 mg via INTRAVENOUS

## 2013-10-04 NOTE — H&P (Signed)
Primary Care Physician: Leonides Grills, MD  Primary Gastroenterologist: Dr. Gala Romney  Pre-Procedure History & Physical:  HPI: Daniel Mcpherson is a 48 y.o. male here for followup. Colonoscopy in early May of this year demonstrated multiple, predominantly rectal, and colon polyps. 2 were greater than a centimeter. The larger rectal polyps were felt to be completely removed with hot snare. However, given the clustering of the rectal polyps in their size, which recommended that he return in 3-6 months for surveillance examination. He has not had any further rectal bleeding.   Past Medical History   Diagnosis  Date   .  Hypertension    .  Anxiety    .  Hyperlipidemia    .  Colon polyps    .  Pancreatitis      hypertriglyceridema, single episode did not require hospitalization,  GI. Around 2000.   .  Tubular adenoma    .  Diverticulosis     Past Surgical History   Procedure  Laterality  Date   .  Knee surgery       left   .  Shoulder surgery   11/2012     left   .  Femoral hernia repair     .  Colonoscopy  N/A  07/04/2013     Dr. Gala Romney- multiple rectal and colonic polyos removed. colonic dierticulosis, tubular adenoma on bx.    Prior to Admission medications   Medication  Sig  Start Date  End Date  Taking?  Authorizing Provider   ALPRAZolam Duanne Moron) 0.5 MG tablet  Take 0.5 mg by mouth at bedtime as needed for sleep.  05/04/13   Yes  Historical Provider, MD   aspirin EC 81 MG tablet  Take 81 mg by mouth daily.    Yes  Historical Provider, MD   escitalopram (LEXAPRO) 10 MG tablet  Take 10 mg by mouth daily.  11/19/12   Yes  Historical Provider, MD   fenofibrate 160 MG tablet  Take 160 mg by mouth daily.  05/15/13   Yes  Historical Provider, MD   Glucosamine Sulfate 500 MG TABS  Take 500 mg by mouth daily.    Yes  Historical Provider, MD   hydrochlorothiazide (HYDRODIURIL) 25 MG tablet  Take 25 mg by mouth daily.    Yes  Historical Provider, MD   lisinopril-hydrochlorothiazide  (PRINZIDE,ZESTORETIC) 10-12.5 MG per tablet  Take 1 tablet by mouth daily.  05/14/13   Yes  Historical Provider, MD   Omega-3 1000 MG CAPS  Take 1 g by mouth daily.    Yes  Historical Provider, MD   Omega-3 Fatty Acids (OMEGA 3 PO)  Take 3,000 mg by mouth daily.    Yes  Historical Provider, MD   polyethylene glycol-electrolytes (TRILYTE) 420 G solution  Take 4,000 mLs by mouth as directed.  06/19/13   Yes  Daneil Dolin, MD   simvastatin (ZOCOR) 80 MG tablet  Take 80 mg by mouth daily at 6 PM.  05/15/13   Yes  Historical Provider, MD    Allergies as of 08/14/2013   .  (No Known Allergies)    Family History   Problem  Relation  Age of Onset   .  Colon cancer  Neg Hx    .  Pancreatitis  Neg Hx    .  Hypercholesterolemia  Mother     History    Social History   .  Marital Status:  Single     Spouse Name:  N/A  Number of Children:  N/A   .  Years of Education:  N/A    Occupational History   .  [employer]     Social History Main Topics   .  Smoking status:  Current Every Day Smoker -- 0.50 packs/day     Types:  Cigarettes   .  Smokeless tobacco:  Not on file   .  Alcohol Use:  No   .  Drug Use:  No   .  Sexual Activity:  Not on file    Other Topics  Concern   .  Not on file    Social History Narrative   .  No narrative on file   Review of Systems:  See HPI, otherwise negative ROS  Physical Exam:   General: Alert, Well-developed, well-nourished, pleasant and cooperative in NAD  Skin: Intact without significant lesions or rashes.  Eyes: Sclera clear, no icterus. Conjunctiva pink.  Ears: Normal auditory acuity.  Nose: No deformity, discharge, or lesions.  Mouth: No deformity or lesions.  Neck: Supple; no masses or thyromegaly. No significant cervical adenopathy.  Lungs: Clear throughout to auscultation. No wheezes, crackles, or rhonchi. No acute distress.  Heart: Regular rate and rhythm; no murmurs, clicks, rubs, or gallops.  Abdomen: Non-distended, normal  bowel sounds. Soft and nontender without appreciable mass or hepatosplenomegaly.  Pulses: Normal pulses noted.  Extremities: Without clubbing or edema.  Impression 48 year old gentleman with multiple colonic and rectal polyps. He had multiple large polyps in the rectum removed in May. He's done well. Given the multiplicity and the the size of the rectal polyps, it was recommended that he return in 3-6 months for followup colonoscopy.  Recommendations: Surveillance colonoscopy today  to reassess his rectum and colon.The risks, benefits, limitations, alternatives and imponderables have been reviewed with the patient. Questions have been answered. All parties are agreeable.        Notice: This dictation was prepared with Dragon dictation along with smaller phrase technology. Any transcriptional errors that result from this process are unintentional and may not be corrected upon review.

## 2013-10-04 NOTE — Discharge Instructions (Addendum)
°  Colonoscopy Discharge Instructions  Read the instructions outlined below and refer to this sheet in the next few weeks. These discharge instructions provide you with general information on caring for yourself after you leave the hospital. Your doctor may also give you specific instructions. While your treatment has been planned according to the most current medical practices available, unavoidable complications occasionally occur. If you have any problems or questions after discharge, call Dr. Gala Romney at [phone]. ACTIVITY  You may resume your regular activity, but move at a slower pace for the next 24 hours.   Take frequent rest periods for the next 24 hours.   Walking will help get rid of the air and reduce the bloated feeling in your belly (abdomen).   No driving for 24 hours (because of the medicine (anesthesia) used during the test).    Do not sign any important legal documents or operate any machinery for 24 hours (because of the anesthesia used during the test).  NUTRITION  Drink plenty of fluids.   You may resume your normal diet as instructed by your doctor.   Begin with a light meal and progress to your normal diet. Heavy or fried foods are harder to digest and may make you feel sick to your stomach (nauseated).   Avoid alcoholic beverages for 24 hours or as instructed.  MEDICATIONS  You may resume your normal medications unless your doctor tells you otherwise.  WHAT YOU CAN EXPECT TODAY  Some feelings of bloating in the abdomen.   Passage of more gas than usual.   Spotting of blood in your stool or on the toilet paper.  IF YOU HAD POLYPS REMOVED DURING THE COLONOSCOPY:  No aspirin products for 7 days or as instructed.   No alcohol for 7 days or as instructed.   Eat a soft diet for the next 24 hours.  FINDING OUT THE RESULTS OF YOUR TEST Not all test results are available during your visit. If your test results are not back during the visit, make an appointment  with your caregiver to find out the results. Do not assume everything is normal if you have not heard from your caregiver or the medical facility. It is important for you to follow up on all of your test results.  SEEK IMMEDIATE MEDICAL ATTENTION IF:  You have more than a spotting of blood in your stool.   Your belly is swollen (abdominal distention).   You are nauseated or vomiting.   You have a temperature over 101.  If You have abdominal pain or discomfort that is severe or gets worse throughout the day.    Polyp and diverticulosis information provided  Further recommendations to follow pending review of pathology report

## 2013-10-04 NOTE — Op Note (Signed)
[facility] [address] [place], [address]   COLONOSCOPY PROCEDURE REPORT  PATIENT: [name], [name]  MR#:         [id number] BIRTHDATE: [birth year] , 48  yrs. old GENDER: Male ENDOSCOPIST: R.  Garfield Cornea, MD FACP FACG REFERRED BY:  Elsie Lincoln, M.D. PROCEDURE DATE:  10/04/2013 PROCEDURE:     Colonoscopy with snare polypectomy  INDICATIONS: History of multiple colonic adenomas removed earlier in the year  INFORMED CONSENT:  The risks, benefits, alternatives and imponderables including but not limited to bleeding, perforation as well as the possibility of a missed lesion have been reviewed.  The potential for biopsy, lesion removal, etc. have also been discussed.  Questions have been answered.  All parties agreeable. Please see the history and physical in the medical record for more information.  MEDICATIONS: Versed 7 mg IV and Demerol 125 mg IV in divided doses. Zofran 4 mg IV.  DESCRIPTION OF PROCEDURE:  After a digital rectal exam was performed, the EC-3890Li ([id number]) and EC-3890Li ([id number]) colonoscope was advanced from the anus through the rectum and colon to the area of the cecum, ileocecal valve and appendiceal orifice. The cecum was deeply intubated.  These structures were well-seen and photographed for the record.  From the level of the cecum and ileocecal valve, the scope was slowly and cautiously withdrawn. The mucosal surfaces were carefully surveyed utilizing scope tip deflection to facilitate fold flattening as needed.  The scope was pulled down into the rectum where a thorough examination including retroflexion was performed.    FINDINGS:  Adequate preparation. Close inspection of the rectal mucosa revealed previously tattooed areas. No evidence of persistent polyp.. Pancolonic diverticulosis. Identified (1) 4 mm polyp in between 2 folds in the mid ascending segment; otherwise, the remainder of the colonic mucosa appeared  normal.  THERAPEUTIC / DIAGNOSTIC MANEUVERS PERFORMED:  The above-mentioned polyp was cold snare removed  COMPLICATIONS: none  CECAL WITHDRAWAL TIME:  26 minutes  IMPRESSION:  Colonic polyp-removed as described above. Pancolonic diverticulosis.  RECOMMENDATIONS: followup on pathology.   _______________________________ eSigned:  R. Garfield Cornea, MD FACP [facility] 10/04/2013 11:16 AM   CC:    PATIENT NAME:  Daniel Mcpherson, Daniel Mcpherson MR#: [id number]

## 2013-10-06 ENCOUNTER — Encounter: Payer: Self-pay | Admitting: Internal Medicine

## 2013-10-10 ENCOUNTER — Encounter (HOSPITAL_COMMUNITY): Payer: Self-pay | Admitting: Internal Medicine

## 2014-05-10 ENCOUNTER — Encounter (HOSPITAL_COMMUNITY): Payer: Self-pay

## 2014-05-10 ENCOUNTER — Emergency Department (HOSPITAL_COMMUNITY)
Admission: EM | Admit: 2014-05-10 | Discharge: 2014-05-10 | Disposition: A | Discharge status: Home / Self Care | Payer: BLUE CROSS/BLUE SHIELD | Attending: Emergency Medicine | Admitting: Emergency Medicine

## 2014-05-10 DIAGNOSIS — Y998 Other external cause status: Secondary | ICD-10-CM | POA: Diagnosis not present

## 2014-05-10 DIAGNOSIS — I10 Essential (primary) hypertension: Secondary | ICD-10-CM | POA: Insufficient documentation

## 2014-05-10 DIAGNOSIS — Z79899 Other long term (current) drug therapy: Secondary | ICD-10-CM | POA: Diagnosis not present

## 2014-05-10 DIAGNOSIS — F419 Anxiety disorder, unspecified: Secondary | ICD-10-CM | POA: Diagnosis not present

## 2014-05-10 DIAGNOSIS — Z8719 Personal history of other diseases of the digestive system: Secondary | ICD-10-CM | POA: Diagnosis not present

## 2014-05-10 DIAGNOSIS — Z7982 Long term (current) use of aspirin: Secondary | ICD-10-CM | POA: Insufficient documentation

## 2014-05-10 DIAGNOSIS — S161XXA Strain of muscle, fascia and tendon at neck level, initial encounter: Secondary | ICD-10-CM | POA: Diagnosis not present

## 2014-05-10 DIAGNOSIS — Y92410 Unspecified street and highway as the place of occurrence of the external cause: Secondary | ICD-10-CM | POA: Diagnosis not present

## 2014-05-10 DIAGNOSIS — E785 Hyperlipidemia, unspecified: Secondary | ICD-10-CM | POA: Diagnosis not present

## 2014-05-10 DIAGNOSIS — Z86018 Personal history of other benign neoplasm: Secondary | ICD-10-CM | POA: Diagnosis not present

## 2014-05-10 DIAGNOSIS — S4992XA Unspecified injury of left shoulder and upper arm, initial encounter: Secondary | ICD-10-CM | POA: Insufficient documentation

## 2014-05-10 DIAGNOSIS — Z720 Tobacco use: Secondary | ICD-10-CM | POA: Diagnosis not present

## 2014-05-10 DIAGNOSIS — S199XXA Unspecified injury of neck, initial encounter: Secondary | ICD-10-CM | POA: Diagnosis present

## 2014-05-10 DIAGNOSIS — Y9389 Activity, other specified: Secondary | ICD-10-CM | POA: Insufficient documentation

## 2014-05-10 DIAGNOSIS — Z86010 Personal history of colonic polyps: Secondary | ICD-10-CM | POA: Diagnosis not present

## 2014-05-10 MED ORDER — IBUPROFEN 800 MG PO TABS: 800.0000 mg | ORAL_TABLET | Freq: Three times a day (TID) | ORAL | Status: DC

## 2014-05-10 MED ORDER — IBUPROFEN 800 MG PO TABS
800.0000 mg | ORAL_TABLET | Freq: Once | ORAL | Status: AC
  Administered 2014-05-10: 800 mg via ORAL
  Filled 2014-05-10: qty 1

## 2014-05-10 MED ORDER — HYDROCODONE-ACETAMINOPHEN 5-325 MG PO TABS: 1.0000 | ORAL_TABLET | Freq: Four times a day (QID) | ORAL | Status: DC | PRN

## 2014-05-10 MED ORDER — CYCLOBENZAPRINE HCL 5 MG PO TABS
5.0000 mg | ORAL_TABLET | Freq: Three times a day (TID) | ORAL | Status: DC
Start: 2014-05-10 — End: 2016-01-08

## 2014-05-10 MED ORDER — CYCLOBENZAPRINE HCL 10 MG PO TABS
5.0000 mg | ORAL_TABLET | Freq: Once | ORAL | Status: AC
  Administered 2014-05-10: 5 mg via ORAL
  Filled 2014-05-10: qty 1
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
## 2014-05-10 NOTE — Discharge Instructions (Signed)
Cervical Sprain A cervical sprain is when the tissues (ligaments) that hold the neck bones in place stretch or tear. HOME CARE   Put ice on the injured area.  Put ice in a plastic bag.  Place a towel between your skin and the bag.  Leave the ice on for 15-20 minutes, 3-4 times a day.  You may have been given a collar to wear. This collar keeps your neck from moving while you heal.  Do not take the collar off unless told by your doctor.  If you have long hair, keep it outside of the collar.  Ask your doctor before changing the position of your collar. You may need to change its position over time to make it more comfortable.  If you are allowed to take off the collar for cleaning or bathing, follow your doctor's instructions on how to do it safely.  Keep your collar clean by wiping it with mild soap and water. Dry it completely. If the collar has removable pads, remove them every 1-2 days to hand wash them with soap and water. Allow them to air dry. They should be dry before you wear them in the collar.  Do not drive while wearing the collar.  Only take medicine as told by your doctor.  Keep all doctor visits as told.  Keep all physical therapy visits as told.  Adjust your work station so that you have good posture while you work.  Avoid positions and activities that make your problems worse.  Warm up and stretch before being active. GET HELP IF:  Your pain is not controlled with medicine.  You cannot take less pain medicine over time as planned.  Your activity level does not improve as expected. GET HELP RIGHT AWAY IF:   You are bleeding.  Your stomach is upset.  You have an allergic reaction to your medicine.  You develop new problems that you cannot explain.  You lose feeling (become numb) or you cannot move any part of your body (paralysis).  You have tingling or weakness in any part of your body.  Your symptoms get worse. Symptoms include:  Pain,  soreness, stiffness, puffiness (swelling), or a burning feeling in your neck.  Pain when your neck is touched.  Shoulder or upper back pain.  Limited ability to move your neck.  Headache.  Dizziness.  Your hands or arms feel week, lose feeling, or tingle.  Muscle spasms.  Difficulty swallowing or chewing. MAKE SURE YOU:   Understand these instructions.  Will watch your condition.  Will get help right away if you are not doing well or get worse. Document Released: 08/04/2007 Document Revised: 10/18/2012 Document Reviewed: 08/23/2012 [facility] Patient Information 2015 [place], [place]. This information is not intended to replace advice given to you by your health care provider. Make sure you discuss any questions you have with your health care provider.  Cryotherapy Cryotherapy means treatment with cold. Ice or gel packs can be used to reduce both pain and swelling. Ice is the most helpful within the first 24 to 48 hours after an injury or flare-up from overusing a muscle or joint. Sprains, strains, spasms, burning pain, shooting pain, and aches can all be eased with ice. Ice can also be used when recovering from surgery. Ice is effective, has very few side effects, and is safe for most people to use. PRECAUTIONS  Ice is not a safe treatment option for people with:  Raynaud phenomenon. This is a condition affecting small blood  vessels in the extremities. Exposure to cold may cause your problems to return.  Cold hypersensitivity. There are many forms of cold hypersensitivity, including:  Cold urticaria. Red, itchy hives appear on the skin when the tissues begin to warm after being iced.  Cold erythema. This is a red, itchy rash caused by exposure to cold.  Cold hemoglobinuria. Red blood cells break down when the tissues begin to warm after being iced. The hemoglobin that carry oxygen are passed into the urine because they cannot combine with blood proteins fast enough.  Numbness  or altered sensitivity in the area being iced. If you have any of the following conditions, do not use ice until you have discussed cryotherapy with your caregiver:  Heart conditions, such as arrhythmia, angina, or chronic heart disease.  High blood pressure.  Healing wounds or open skin in the area being iced.  Current infections.  Rheumatoid arthritis.  Poor circulation.  Diabetes. Ice slows the blood flow in the region it is applied. This is beneficial when trying to stop inflamed tissues from spreading irritating chemicals to surrounding tissues. However, if you expose your skin to cold temperatures for too long or without the proper protection, you can damage your skin or nerves. Watch for signs of skin damage due to cold. HOME CARE INSTRUCTIONS Follow these tips to use ice and cold packs safely.  Place a dry or damp towel between the ice and skin. A damp towel will cool the skin more quickly, so you may need to shorten the time that the ice is used.  For a more rapid response, add gentle compression to the ice.  Ice for no more than 10 to 20 minutes at a time. The bonier the area you are icing, the less time it will take to get the benefits of ice.  Check your skin after 5 minutes to make sure there are no signs of a poor response to cold or skin damage.  Rest 20 minutes or more between uses.  Once your skin is numb, you can end your treatment. You can test numbness by very lightly touching your skin. The touch should be so light that you do not see the skin dimple from the pressure of your fingertip. When using ice, most people will feel these normal sensations in this order: cold, burning, aching, and numbness.  Do not use ice on someone who cannot communicate their responses to pain, such as small children or people with dementia. HOW TO MAKE AN ICE PACK Ice packs are the most common way to use ice therapy. Other methods include ice massage, ice baths, and cryosprays.  Muscle creams that cause a cold, tingly feeling do not offer the same benefits that ice offers and should not be used as a substitute unless recommended by your caregiver. To make an ice pack, do one of the following:  Place crushed ice or a bag of frozen vegetables in a sealable plastic bag. Squeeze out the excess air. Place this bag inside another plastic bag. Slide the bag into a pillowcase or place a damp towel between your skin and the bag.  Mix 3 parts water with 1 part rubbing alcohol. Freeze the mixture in a sealable plastic bag. When you remove the mixture from the freezer, it will be slushy. Squeeze out the excess air. Place this bag inside another plastic bag. Slide the bag into a pillowcase or place a damp towel between your skin and the bag. SEEK MEDICAL CARE IF:  You  develop white spots on your skin. This may give the skin a blotchy (mottled) appearance.  Your skin turns blue or pale.  Your skin becomes waxy or hard.  Your swelling gets worse. MAKE SURE YOU:   Understand these instructions.  Will watch your condition.  Will get help right away if you are not doing well or get worse. Document Released: 10/12/2010 Document Revised: 07/02/2013 Document Reviewed: 10/12/2010 [facility] Patient Information 2015 [place], [place]. This information is not intended to replace advice given to you by your health care provider. Make sure you discuss any questions you have with your health care provider.
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
## 2016-01-07 NOTE — Progress Notes (Signed)
Cardiology Office Note   Date:  01/08/2016   ID:  Daniel Mcpherson, DOB 1966/02/28, MRN [id number]  PCP:  Leonides Grills, MD  Cardiologist:   Jenkins Rouge, MD   No chief complaint on file.     History of Present Illness: Daniel Mcpherson is a 50 y.o. male who presents for evaluation of chest pain CRF;s include HTN and elevated lipids  The patient denies chest pain thought he was her for BP. After some discussion seems like his major issue Is hypertriglyceridemia They run in 400 range when he does not watch his diet and 200 range when he does Has been on current statin and fibrate dose for years. Indicated use to take lipitor and zetia. Not a diabetic No vascular disease. Years ago had pancreatitis before elevated triglycerides identified. Denies ETOH Compliant with meds   No chest pain, dyspnea palpitations   Labs:  12/18/15:  Normal LFT;s Triglycerides 455 TC 153 LDL not accurate TSH 1.9 random glucose 108 no A1c     Past Medical History:  Diagnosis Date  . Anxiety   . Colon polyps   . Diverticulosis   . Hyperlipidemia   . Hypertension   . Pancreatitis    hypertriglyceridema, single episode did not require hospitalization, [facility]. Around 2000.  . Tubular adenoma     Past Surgical History:  Procedure Laterality Date  . COLONOSCOPY N/A 07/04/2013   Dr. Gala Romney- multiple rectal and colonic polyos removed. colonic dierticulosis, tubular adenoma on bx.  . COLONOSCOPY N/A 10/04/2013   Procedure: COLONOSCOPY;  Surgeon: Daneil Dolin, MD;  [facility];  Service: Endoscopy;  Laterality: N/A;  10:30 AM  . FEMORAL HERNIA REPAIR    . KNEE SURGERY     left  . SHOULDER SURGERY  11/2012   left     Current Outpatient Prescriptions  Medication Sig Dispense Refill  . aspirin EC 81 MG tablet Take 81 mg by mouth daily.    . cholecalciferol (VITAMIN D) 1000 UNITS tablet Take 1,000 Units by mouth daily.    Marland Kitchen escitalopram (LEXAPRO) 10 MG tablet Take 10 mg by mouth  daily.    . fenofibrate 160 MG tablet Take 160 mg by mouth daily.     . Glucosamine Sulfate 500 MG TABS Take 500 mg by mouth daily.    Marland Kitchen ibuprofen (ADVIL,MOTRIN) 800 MG tablet Take 1 tablet (800 mg total) by mouth 3 (three) times daily. 30 tablet 0  . lisinopril-hydrochlorothiazide (PRINZIDE,ZESTORETIC) 10-12.5 MG per tablet Take 1 tablet by mouth daily.     . Omega-3 1000 MG CAPS Take 1 g by mouth daily.    . simvastatin (ZOCOR) 80 MG tablet Take 80 mg by mouth daily at 6 PM.      No current facility-administered medications for this visit.     Allergies:   Patient has no known allergies.    Social History:  The patient  reports that he has been smoking Cigarettes.  He started smoking about 34 years ago. He has been smoking about 0.50 packs per day. He has never used smokeless tobacco. He reports that he does not drink alcohol or use drugs.   Family History:  The patient's family history includes Hypercholesterolemia in his mother.    ROS:  Please see the history of present illness.   Otherwise, review of systems are positive for none.   All other systems are reviewed and negative.    PHYSICAL EXAM: VS:  BP 114/80 (BP  Location: Right Arm)   Pulse 100   Ht 5\' 6"  (1.676 m)   Wt 82.1 kg (181 lb)   SpO2 94%   BMI 29.21 kg/m  , BMI Body mass index is 29.21 kg/m. Affect appropriate Healthy:  appears stated age 22: normal Neck supple with no adenopathy JVP normal no bruits no thyromegaly Lungs clear with no wheezing and good diaphragmatic motion Heart:  S1/S2 no murmur, no rub, gallop or click PMI normal Abdomen: benighn, BS positve, no tenderness, no AAA no bruit.  No HSM or HJR Distal pulses intact with no bruits No edema Neuro non-focal Skin warm and dry No muscular weakness    EKG:  2014 ST rate 103 RSR'   Recent Labs: No results found for requested labs within last 8760 hours.    Lipid Panel No results found for: CHOL, TRIG, HDL, CHOLHDL, VLDL, LDLCALC,  LDLDIRECT    Wt Readings from Last 3 Encounters:  01/08/16 82.1 kg (181 lb)  05/10/14 74.8 kg (165 lb)  10/04/13 70.3 kg (155 lb)      Other studies Reviewed: Additional studies/ records that were reviewed today include: Notes [facility] .    ASSESSMENT AND PLAN:  1.  Chest Pain patient denies no need for ETT 2. HTN: Well controlled.  Continue current medications and low sodium Dash type diet.   3. Elevated Lipids  Continue current meds Key to lowering is his diet Will get coronary calcium Score to risk stratify for CAD Only if very high would I refer to lipid clinic    Current medicines are reviewed at length with the patient today.  The patient does not have concerns regarding medicines.  The following changes have been made:  no change  Labs/ tests ordered today include: Calcium Score   Orders Placed This Encounter  Procedures  . CT CARDIAC SCORING  . EKG 12-Lead     Disposition:   FU with in a year      Signed, Jenkins Rouge, MD  01/08/2016 9:18 AM    [address], [place], [place]  [address] Phone: [phone]; Fax: [phone]

## 2016-01-08 ENCOUNTER — Ambulatory Visit (INDEPENDENT_AMBULATORY_CARE_PROVIDER_SITE_OTHER): Payer: PRIVATE HEALTH INSURANCE | Admitting: Cardiovascular Disease

## 2016-01-08 ENCOUNTER — Encounter: Payer: Self-pay | Admitting: Cardiovascular Disease

## 2016-01-08 VITALS — BP 114/80 | HR 100 | Ht 66.0 in | Wt 181.0 lb

## 2016-01-08 DIAGNOSIS — I10 Essential (primary) hypertension: Secondary | ICD-10-CM | POA: Diagnosis not present

## 2016-01-08 DIAGNOSIS — E781 Pure hyperglyceridemia: Secondary | ICD-10-CM

## 2016-01-08 DIAGNOSIS — I2510 Atherosclerotic heart disease of native coronary artery without angina pectoris: Secondary | ICD-10-CM | POA: Diagnosis not present

## 2016-01-08 NOTE — Patient Instructions (Signed)
Medication Instructions:  Your physician recommends that you continue on your current medications as directed. Please refer to the Current Medication list given to you today.   Labwork: NONE  Testing/Procedures: Coronary Calcium Score  Follow-Up: Your physician wants you to follow-up in: 1 year .  You will receive a reminder letter in the mail two months in advance. If you don't receive a letter, please call our office to schedule the follow-up appointment.   Any Other Special Instructions Will Be Listed Below (If Applicable).     If you need a refill on your cardiac medications before your next appointment, please call your pharmacy.

## 2016-01-16 ENCOUNTER — Ambulatory Visit (INDEPENDENT_AMBULATORY_CARE_PROVIDER_SITE_OTHER)
Admission: RE | Admit: 2016-01-16 | Discharge: 2016-01-16 | Disposition: A | Discharge status: Home / Self Care | Payer: Self-pay | Source: Ambulatory Visit | Attending: Cardiovascular Disease | Admitting: Cardiovascular Disease

## 2016-01-16 DIAGNOSIS — I2510 Atherosclerotic heart disease of native coronary artery without angina pectoris: Secondary | ICD-10-CM

## 2016-03-23 ENCOUNTER — Ambulatory Visit: Payer: PRIVATE HEALTH INSURANCE | Admitting: Urology

## 2016-05-04 ENCOUNTER — Ambulatory Visit (INDEPENDENT_AMBULATORY_CARE_PROVIDER_SITE_OTHER): Payer: No Typology Code available for payment source | Admitting: Urology

## 2016-05-04 DIAGNOSIS — R972 Elevated prostate specific antigen [PSA]: Secondary | ICD-10-CM | POA: Diagnosis not present

## 2016-05-12 ENCOUNTER — Other Ambulatory Visit: Payer: Self-pay | Admitting: Urology

## 2016-05-12 DIAGNOSIS — R972 Elevated prostate specific antigen [PSA]: Secondary | ICD-10-CM

## 2016-05-21 ENCOUNTER — Telehealth: Payer: Self-pay | Admitting: Internal Medicine

## 2016-05-21 NOTE — Telephone Encounter (Signed)
[phone]   Please call patient regarding rectal bleeding, he has an appointment coming up but wants to know if he should also go to his pcp because he is having lower abd pain when he presses on a certain area and bends over.

## 2016-05-21 NOTE — Telephone Encounter (Signed)
Tried to call and Vm not set up.

## 2016-05-24 ENCOUNTER — Telehealth: Payer: Self-pay | Admitting: Internal Medicine

## 2016-05-24 NOTE — Telephone Encounter (Signed)
Spoke with the pt, he has been doing well until last Thursday- he started having abd pain around his navel and bloating after lunch, he had two small bm's that evening that had BRB around the stool. By Saturday the pain was better, he just sore and only had the abd pain if he pushed on his abd. No blood seen in his stool. Sunday he had a normal bm, no pain, no blood. He is not having any pain now, sometimes he still feels like he needs to have a bm and when he goes to the bathroom, he wont have a bm. He just wanted to let us know what was going on. He is due for a 3 year tcs this year. He has an appointment with [facility] on 06/02/16. Pt is having a prostate biopsy tomorrow.   Routing to RMR for FYI, he said to call if we had any recommendations but if not he would see [place] on the 4th.

## 2016-05-24 NOTE — Telephone Encounter (Signed)
Tried to return call. Vm not set up.

## 2016-05-24 NOTE — Telephone Encounter (Signed)
Sounds like an acute episode; and getting better. So long as he's improving, just keep appt for the 4th.

## 2016-05-24 NOTE — Telephone Encounter (Signed)
Patient called back regarding abd pain, he needs to speak to a nurse today please

## 2016-05-25 ENCOUNTER — Ambulatory Visit (HOSPITAL_COMMUNITY)
Admission: RE | Admit: 2016-05-25 | Discharge: 2016-05-25 | Disposition: A | Discharge status: Home / Self Care | Payer: No Typology Code available for payment source | Source: Ambulatory Visit | Attending: Urology | Admitting: Urology

## 2016-05-25 ENCOUNTER — Encounter (HOSPITAL_COMMUNITY): Payer: Self-pay

## 2016-05-25 ENCOUNTER — Other Ambulatory Visit: Payer: Self-pay | Admitting: Urology

## 2016-05-25 DIAGNOSIS — R972 Elevated prostate specific antigen [PSA]: Secondary | ICD-10-CM | POA: Diagnosis not present

## 2016-05-25 MED ORDER — GENTAMICIN SULFATE 40 MG/ML IJ SOLN
INTRAMUSCULAR | Status: AC
  Filled 2016-05-25: qty 4

## 2016-05-25 MED ORDER — LIDOCAINE HCL (PF) 2 % IJ SOLN
10.0000 mL | Freq: Once | INTRAMUSCULAR | Status: AC
  Administered 2016-05-25: 10 mL

## 2016-05-25 MED ORDER — LIDOCAINE HCL (PF) 2 % IJ SOLN
INTRAMUSCULAR | Status: AC
  Filled 2016-05-25: qty 10

## 2016-05-25 MED ORDER — GENTAMICIN SULFATE 40 MG/ML IJ SOLN
160.0000 mg | Freq: Once | INTRAMUSCULAR | Status: AC
  Administered 2016-05-25: 160 mg via INTRAMUSCULAR

## 2016-05-25 NOTE — Discharge Instructions (Signed)
Transrectal Ultrasound-Guided Biopsy °A transrectal ultrasound-guided biopsy is a procedure to take samples of tissue from your prostate. Ultrasound images are used to guide the procedure. It is usually done to check the prostate gland for cancer. °What happens before the procedure? °· Do not eat or drink after midnight on the night before your procedure. °· Take medicines as your doctor tells you. °· Your doctor may have you stop taking some medicines 5-7 days before the procedure. °· You will be given an enema before your procedure. During an enema, a liquid is put into your butt (rectum) to clear out waste. °· You may have lab tests the day of your procedure. °· Make plans to have someone drive you home. °What happens during the procedure? °· You will be given medicine to help you relax before the procedure. An IV tube will be put into one of your veins. It will be used to give fluids and medicine. °· You will be given medicine to reduce the risk of infection (antibiotic). °· You will be placed on your side. °· A probe with gel will be put in your butt. This is used to take pictures of your prostate and the area around it. °· A medicine to numb the area is put into your prostate. °· A biopsy needle is then inserted and guided to your prostate. °· Samples of prostate tissue are taken. The needle is removed. °· The samples are sent to a lab to be checked. Results are usually back in 2-3 days. °What happens after the procedure? °· You will be taken to a room where you will be watched until you are doing okay. °· You may have some pain in the area around your butt. You will be given medicines for this. °· You may be able to go home the same day. Sometimes, an overnight stay in the hospital is needed. °This information is not intended to replace advice given to you by your health care provider. Make sure you discuss any questions you have with your health care provider. °Document Released: 02/03/2009 Document Revised:  07/24/2015 Document Reviewed: 10/04/2012 °Elsevier Interactive Patient Education © 2017 Elsevier Inc. ° °

## 2016-05-31 NOTE — Telephone Encounter (Signed)
Pt has not returned call, but OV appt is on 06/02/2016.

## 2016-06-02 ENCOUNTER — Encounter: Payer: Self-pay | Admitting: Nurse Practitioner

## 2016-06-02 ENCOUNTER — Telehealth: Payer: Self-pay

## 2016-06-02 ENCOUNTER — Ambulatory Visit (INDEPENDENT_AMBULATORY_CARE_PROVIDER_SITE_OTHER): Payer: No Typology Code available for payment source | Admitting: Nurse Practitioner

## 2016-06-02 ENCOUNTER — Other Ambulatory Visit: Payer: Self-pay

## 2016-06-02 VITALS — BP 120/84 | HR 97 | Temp 98.7°F | Ht 66.0 in | Wt 182.6 lb

## 2016-06-02 DIAGNOSIS — Z860100 Personal history of colon polyps, unspecified: Secondary | ICD-10-CM

## 2016-06-02 DIAGNOSIS — K625 Hemorrhage of anus and rectum: Secondary | ICD-10-CM

## 2016-06-02 MED ORDER — NA SULFATE-K SULFATE-MG SULF 17.5-3.13-1.6 GM/177ML PO SOLN: 1.0000 | ORAL | 0 refills | Status: DC

## 2016-06-02 NOTE — Patient Instructions (Signed)
1. We will schedule your procedure for you. 2. Further recommendations to be made based on the results of your procedure. 3. Return for follow-up based on postprocedure recommendations. 

## 2016-06-02 NOTE — Telephone Encounter (Signed)
PA info submitted online via [facility] website. Notification/prior authorization reference number is [id number].

## 2016-06-02 NOTE — Assessment & Plan Note (Signed)
History of multiple adenomatous colon polyps. Last colonoscopy 2015 with recommended 3 year repeat. He will be due in August of this year. Given rectal bleeding we will proceed with colonoscopy as noted above. Return for follow-up based on postprocedure recommendations.

## 2016-06-02 NOTE — Progress Notes (Signed)
Referring Provider: Elsie Lincoln, MD Primary Care Physician:  Glo Herring, MD Primary GI:  Dr. Gala Romney  Chief Complaint  Patient presents with  . Rectal Bleeding    HPI:   Daniel Mcpherson is a 51 y.o. male who presents for blood in stool. The patient was last seen in our office 08/14/2013 for personal history of colon polyps. At that time it was noted he had previously had a colonoscopy in May of the same year with multiple colon polyps 2 of which were greater than a centimeter in diameter. Recommended repeat 3-6 month surveillance exam. No further rectal bleeding at that time.   Repeat colonoscopy completed 10/04/2013 which found a single 4 mm polyp in the ascending colon status post removal, pancolonic diverticulosis. Surgical pathology found the polyp to be tubular adenoma. Recommended repeat colonoscopy in 3 years.  Today he states he's doing well overall. 2 weeks ago had the sensation of needing to have a bowel movement along with some mild lower abdominal tenderness. Has a small bowel movement with brbpr x 2. No recurrence since. Has some residual LLQ discomfort worse with leaning to the left; this resolved over the next few days. Denies other or recurrent abdominal pain, N/V, melena, fever, chills, unintentional weight loss, acute changes in bowel movements. No overt constipation and no straining. No overt hemorrhoid symptoms currently. Denies chest pain, dyspnea, dizziness, lightheadedness, syncope, near syncope. Denies any other upper or lower GI symptoms.  Past Medical History:  Diagnosis Date  . Anxiety   . Colon polyps   . Diverticulosis   . Hyperlipidemia   . Hypertension   . Pancreatitis    hypertriglyceridema, single episode did not require hospitalization, [facility]. Around 2000.  . Tubular adenoma     Past Surgical History:  Procedure Laterality Date  . COLONOSCOPY N/A 07/04/2013   Dr. Gala Romney- multiple rectal and colonic polyos removed. colonic dierticulosis,  tubular adenoma on bx.  . COLONOSCOPY N/A 10/04/2013   Procedure: COLONOSCOPY;  Surgeon: Daneil Dolin, MD;  [facility];  Service: Endoscopy;  Laterality: N/A;  10:30 AM  . FEMORAL HERNIA REPAIR    . KNEE SURGERY     left  . SHOULDER SURGERY  11/2012   left    Current Outpatient Prescriptions  Medication Sig Dispense Refill  . aspirin EC 81 MG tablet Take 81 mg by mouth daily.    . cholecalciferol (VITAMIN D) 1000 UNITS tablet Take 1,000 Units by mouth daily.    Marland Kitchen escitalopram (LEXAPRO) 10 MG tablet Take 10 mg by mouth daily.    . fenofibrate 160 MG tablet Take 160 mg by mouth daily.     . Glucosamine Sulfate 500 MG TABS Take 500 mg by mouth daily.    Marland Kitchen ibuprofen (ADVIL,MOTRIN) 800 MG tablet Take 1 tablet (800 mg total) by mouth 3 (three) times daily. (Patient taking differently: Take 800 mg by mouth every 8 (eight) hours as needed. ) 30 tablet 0  . lisinopril-hydrochlorothiazide (PRINZIDE,ZESTORETIC) 10-12.5 MG per tablet Take 1 tablet by mouth daily.     . NON FORMULARY Probiotic daily    . Omega-3 1000 MG CAPS Take 1 g by mouth daily.    . simvastatin (ZOCOR) 80 MG tablet Take 80 mg by mouth daily at 6 PM.      No current facility-administered medications for this visit.     Allergies as of 06/02/2016  . (No Known Allergies)    Family History  Problem Relation  Age of Onset  . Hypercholesterolemia Mother   . Colon cancer Neg Hx   . Pancreatitis Neg Hx     Social History   Social History  . Marital status: Single    Spouse name: N/A  . Number of children: N/A  . Years of education: N/A   Occupational History  . [place] [employer]    Social History Main Topics  . Smoking status: Current Every Day Smoker    Packs/day: 0.50    Types: Cigarettes    Start date: 01/07/1982  . Smokeless tobacco: Never Used     Comment: 1/2 pack daily  . Alcohol use No  . Drug use: No  . Sexual activity: Not Asked   Other  Topics Concern  . None   Social History Narrative  . None    Review of Systems: General: Negative for anorexia, weight loss, fever, chills, fatigue, weakness. ENT: Negative for hoarseness, difficulty swallowing. CV: Negative for chest pain, angina, palpitations, peripheral edema.  Respiratory: Negative for dyspnea at rest, cough, sputum, wheezing.  GI: See history of present illness. Endo: Negative for unusual weight change.  Heme: Negative for bruising or bleeding.   Physical Exam: BP 120/84   Pulse 97   Temp 98.7 F (37.1 C) (Oral)   Ht 5\' 6"  (1.676 m)   Wt 182 lb 9.6 oz (82.8 kg)   BMI 29.47 kg/m  General:   Alert and oriented. Pleasant and cooperative. Well-nourished and well-developed.  Eyes:  Without icterus, sclera clear and conjunctiva pink.  Ears:  Normal auditory acuity. Cardiovascular:  S1, S2 present without murmurs appreciated. Extremities without clubbing or edema. Respiratory:  Clear to auscultation bilaterally. No wheezes, rales, or rhonchi. No distress.  Gastrointestinal:  +BS, soft, non-tender and non-distended. No HSM noted. No guarding or rebound. No masses appreciated.  Rectal:  Deferred  Musculoskalatal:  Symmetrical without gross deformities. Neurologic:  Alert and oriented x4;  grossly normal neurologically. Psych:  Alert and cooperative. Normal mood and affect. Heme/Lymph/Immune: No excessive bruising noted.    06/02/2016 1:56 PM   Disclaimer: This note was dictated with voice recognition software. Similar sounding words can inadvertently be transcribed and may not be corrected upon review.

## 2016-06-02 NOTE — Progress Notes (Signed)
cc'ed to pcp °

## 2016-06-02 NOTE — Assessment & Plan Note (Signed)
He had an episode of rectal bleeding 2 weeks ago. Has a history of multiple colon polyps as noted below. His rectal bleeding occurred when he had a sensation of needing to use the bathroom. Denies overt constipation and overt hemorrhoid symptoms. Given his history he is currently due for a repeat colonoscopy which will allow [place] to further evaluate for source of rectal bleeding. Return for follow-up based on postprocedure recommendations.  Proceed with TCS with Dr. Gala Romney in near future: the risks, benefits, and alternatives have been discussed with the patient in detail. The patient states understanding and desires to proceed.  The patient is on Lexapro 10 mg daily. The patient is not on any other anticoagulants, anxiolytics, chronic pain medications, or antidepressants. Conscious sedation should be adequate for his procedure as it was for his last procedure.

## 2016-06-07 ENCOUNTER — Telehealth: Payer: Self-pay | Admitting: Internal Medicine

## 2016-06-07 ENCOUNTER — Other Ambulatory Visit: Payer: Self-pay

## 2016-06-07 MED ORDER — PEG 3350-KCL-NA BICARB-NACL 420 G PO SOLR: 4000.0000 mL | ORAL | 0 refills | Status: DC

## 2016-06-07 NOTE — Telephone Encounter (Signed)
[facility] called to say that patient's insurance doesn't cover prep prescription and she was going to fax [place] something showing what his insurance would cover.

## 2016-06-07 NOTE — Telephone Encounter (Signed)
New prep and instructions have been sent. Tried to cal him with no answer

## 2016-07-08 ENCOUNTER — Ambulatory Visit (HOSPITAL_COMMUNITY)
Admission: RE | Admit: 2016-07-08 | Discharge: 2016-07-08 | Disposition: A | Discharge status: Home / Self Care | Payer: No Typology Code available for payment source | Source: Ambulatory Visit | Attending: Internal Medicine | Admitting: Internal Medicine

## 2016-07-08 ENCOUNTER — Encounter (HOSPITAL_COMMUNITY)
Admission: RE | Disposition: A | Discharge status: Home / Self Care | Payer: Self-pay | Source: Ambulatory Visit | Attending: Internal Medicine

## 2016-07-08 ENCOUNTER — Encounter (HOSPITAL_COMMUNITY): Payer: Self-pay | Admitting: *Deleted

## 2016-07-08 DIAGNOSIS — K921 Melena: Secondary | ICD-10-CM | POA: Insufficient documentation

## 2016-07-08 DIAGNOSIS — D123 Benign neoplasm of transverse colon: Secondary | ICD-10-CM | POA: Diagnosis not present

## 2016-07-08 DIAGNOSIS — K5730 Diverticulosis of large intestine without perforation or abscess without bleeding: Secondary | ICD-10-CM | POA: Insufficient documentation

## 2016-07-08 DIAGNOSIS — K640 First degree hemorrhoids: Secondary | ICD-10-CM | POA: Insufficient documentation

## 2016-07-08 DIAGNOSIS — Z79899 Other long term (current) drug therapy: Secondary | ICD-10-CM | POA: Insufficient documentation

## 2016-07-08 DIAGNOSIS — K625 Hemorrhage of anus and rectum: Secondary | ICD-10-CM

## 2016-07-08 DIAGNOSIS — Z86010 Personal history of colonic polyps: Secondary | ICD-10-CM

## 2016-07-08 HISTORY — PX: COLONOSCOPY: SHX5424

## 2016-07-08 HISTORY — PX: POLYPECTOMY: SHX5525

## 2016-07-08 SURGERY — COLONOSCOPY
Anesthesia: Moderate Sedation

## 2016-07-08 MED ORDER — ONDANSETRON HCL 4 MG/2ML IJ SOLN
INTRAMUSCULAR | Status: AC
  Filled 2016-07-08: qty 2

## 2016-07-08 MED ORDER — SODIUM CHLORIDE 0.9 % IV SOLN
INTRAVENOUS | Status: DC
  Administered 2016-07-08: 10:00:00 via INTRAVENOUS

## 2016-07-08 MED ORDER — MEPERIDINE HCL 100 MG/ML IJ SOLN
INTRAMUSCULAR | Status: DC | PRN
  Administered 2016-07-08: 25 mg via INTRAVENOUS
  Administered 2016-07-08: 50 mg via INTRAVENOUS
  Administered 2016-07-08: 25 mg via INTRAVENOUS

## 2016-07-08 MED ORDER — MIDAZOLAM HCL 5 MG/5ML IJ SOLN
INTRAMUSCULAR | Status: DC | PRN
  Administered 2016-07-08: 2 mg via INTRAVENOUS
  Administered 2016-07-08: 1 mg via INTRAVENOUS
  Administered 2016-07-08 (×2): 2 mg via INTRAVENOUS

## 2016-07-08 MED ORDER — MEPERIDINE HCL 100 MG/ML IJ SOLN
INTRAMUSCULAR | Status: AC
  Filled 2016-07-08: qty 2

## 2016-07-08 MED ORDER — MIDAZOLAM HCL 5 MG/5ML IJ SOLN
INTRAMUSCULAR | Status: AC
  Filled 2016-07-08: qty 10

## 2016-07-08 MED ORDER — ONDANSETRON HCL 4 MG/2ML IJ SOLN
INTRAMUSCULAR | Status: DC | PRN
  Administered 2016-07-08: 4 mg via INTRAVENOUS

## 2016-07-08 NOTE — Discharge Instructions (Addendum)
Colonoscopy Discharge Instructions  Read the instructions outlined below and refer to this sheet in the next few weeks. These discharge instructions provide you with general information on caring for yourself after you leave the hospital. Your doctor may also give you specific instructions. While your treatment has been planned according to the most current medical practices available, unavoidable complications occasionally occur. If you have any problems or questions after discharge, call Dr. Gala Romney at [phone]. ACTIVITY  You may resume your regular activity, but move at a slower pace for the next 24 hours.   Take frequent rest periods for the next 24 hours.   Walking will help get rid of the air and reduce the bloated feeling in your belly (abdomen).   No driving for 24 hours (because of the medicine (anesthesia) used during the test).    Do not sign any important legal documents or operate any machinery for 24 hours (because of the anesthesia used during the test).  NUTRITION  Drink plenty of fluids.   You may resume your normal diet as instructed by your doctor.   Begin with a light meal and progress to your normal diet. Heavy or fried foods are harder to digest and may make you feel sick to your stomach (nauseated).   Avoid alcoholic beverages for 24 hours or as instructed.  MEDICATIONS  You may resume your normal medications unless your doctor tells you otherwise.  WHAT YOU CAN EXPECT TODAY  Some feelings of bloating in the abdomen.   Passage of more gas than usual.   Spotting of blood in your stool or on the toilet paper.  IF YOU HAD POLYPS REMOVED DURING THE COLONOSCOPY:  No aspirin products for 7 days or as instructed.   No alcohol for 7 days or as instructed.   Eat a soft diet for the next 24 hours.  FINDING OUT THE RESULTS OF YOUR TEST Not all test results are available during your visit. If your test results are not back during the visit, make an appointment  with your caregiver to find out the results. Do not assume everything is normal if you have not heard from your caregiver or the medical facility. It is important for you to follow up on all of your test results.  SEEK IMMEDIATE MEDICAL ATTENTION IF:  You have more than a spotting of blood in your stool.   Your belly is swollen (abdominal distention).   You are nauseated or vomiting.   You have a temperature over 101.   You have abdominal pain or discomfort that is severe or gets worse throughout the day.     Colon polyp and diverticulosis information provided  Begin Benefiber 1 tablespoon twice daily  Further recommendations to follow pending review of pathology report    Colon Polyps Polyps are tissue growths inside the body. Polyps can grow in many places, including the large intestine (colon). A polyp may be a round bump or a mushroom-shaped growth. You could have one polyp or several. Most colon polyps are noncancerous (benign). However, some colon polyps can become cancerous over time. What are the causes? The exact cause of colon polyps is not known. What increases the risk? This condition is more likely to develop in people who:  Have a family history of colon cancer or colon polyps.  Are older than 36 or older than 45 if they are African American.  Have inflammatory bowel disease, such as ulcerative colitis or Crohn disease.  Are overweight.  Smoke cigarettes.  Do not get enough exercise.  Drink too much alcohol.  Eat a diet that is:  High in fat and red meat.  Low in fiber.  Had childhood cancer that was treated with abdominal radiation. What are the signs or symptoms? Most polyps do not cause symptoms. If you have symptoms, they may include:  Blood coming from your rectum when having a bowel movement.  Blood in your stool.The stool may look dark red or black.  A change in bowel habits, such as constipation or diarrhea. How is this  diagnosed? This condition is diagnosed with a colonoscopy. This is a procedure that uses a lighted, flexible scope to look at the inside of your colon. How is this treated? Treatment for this condition involves removing any polyps that are found. Those polyps will then be tested for cancer. If cancer is found, your health care provider will talk to you about options for colon cancer treatment. Follow these instructions at home: Diet   Eat plenty of fiber, such as fruits, vegetables, and whole grains.  Eat foods that are high in calcium and vitamin D, such as milk, cheese, yogurt, eggs, liver, fish, and broccoli.  Limit foods high in fat, red meats, and processed meats, such as hot dogs, sausage, bacon, and lunch meats.  Maintain a healthy weight, or lose weight if recommended by your health care provider. General instructions   Do not smoke cigarettes.  Do not drink alcohol excessively.  Keep all follow-up visits as told by your health care provider. This is important. This includes keeping regularly scheduled colonoscopies. Talk to your health care provider about when you need a colonoscopy.  Exercise every day or as told by your health care provider. Contact a health care provider if:  You have new or worsening bleeding during a bowel movement.  You have new or increased blood in your stool.  You have a change in bowel habits.  You unexpectedly lose weight. This information is not intended to replace advice given to you by your health care provider. Make sure you discuss any questions you have with your health care provider. Document Released: 11/12/2003 Document Revised: 07/24/2015 Document Reviewed: 01/06/2015 Elsevier Interactive Patient Education  2017 Elsevier Inc.   Diverticulosis Diverticulosis is a condition that develops when small pouches (diverticula) form in the wall of the large intestine (colon). The colon is where water is absorbed and stool is formed. The  pouches form when the inside layer of the colon pushes through weak spots in the outer layers of the colon. You may have a few pouches or many of them. What are the causes? The cause of this condition is not known. What increases the risk? The following factors may make you more likely to develop this condition:  Being older than age 6. Your risk for this condition increases with age. Diverticulosis is rare among people younger than age 31. By age 31, many people have it.  Eating a low-fiber diet.  Having frequent constipation.  Being overweight.  Not getting enough exercise.  Smoking.  Taking over-the-counter pain medicines, like aspirin and ibuprofen.  Having a family history of diverticulosis. What are the signs or symptoms? In most people, there are no symptoms of this condition. If you do have symptoms, they may include:  Bloating.  Cramps in the abdomen.  Constipation or diarrhea.  Pain in the lower left side of the abdomen. How is this diagnosed? This condition is most often diagnosed during an exam for other colon  problems. Because diverticulosis usually has no symptoms, it often cannot be diagnosed independently. This condition may be diagnosed by:  Using a flexible scope to examine the colon (colonoscopy).  Taking an X-ray of the colon after dye has been put into the colon (barium enema).  Doing a CT scan. How is this treated? You may not need treatment for this condition if you have never developed an infection related to diverticulosis. If you have had an infection before, treatment may include:  Eating a high-fiber diet. This may include eating more fruits, vegetables, and grains.  Taking a fiber supplement.  Taking a live bacteria supplement (probiotic).  Taking medicine to relax your colon.  Taking antibiotic medicines. Follow these instructions at home:  Drink 6-8 glasses of water or more each day to prevent constipation.  Try not to strain when  you have a bowel movement.  If you have had an infection before:  Eat more fiber as directed by your health care provider or your diet and nutrition specialist (dietitian).  Take a fiber supplement or probiotic, if your health care provider approves.  Take over-the-counter and prescription medicines only as told by your health care provider.  If you were prescribed an antibiotic, take it as told by your health care provider. Do not stop taking the antibiotic even if you start to feel better.  Keep all follow-up visits as told by your health care provider. This is important. Contact a health care provider if:  You have pain in your abdomen.  You have bloating.  You have cramps.  You have not had a bowel movement in 3 days. Get help right away if:  Your pain gets worse.  Your bloating becomes very bad.  You have a fever or chills, and your symptoms suddenly get worse.  You vomit.  You have bowel movements that are bloody or black.  You have bleeding from your rectum. Summary  Diverticulosis is a condition that develops when small pouches (diverticula) form in the wall of the large intestine (colon).  You may have a few pouches or many of them.  This condition is most often diagnosed during an exam for other colon problems.  If you have had an infection related to diverticulosis, treatment may include increasing the fiber in your diet, taking supplements, or taking medicines. This information is not intended to replace advice given to you by your health care provider. Make sure you discuss any questions you have with your health care provider. Document Released: 11/13/2003 Document Revised: 01/05/2016 Document Reviewed: 01/05/2016 Elsevier Interactive Patient Education  2017 [employer].

## 2016-07-08 NOTE — H&P (Signed)
@LOGO @   Primary Care Physician:  Redmond School, MD Primary Gastroenterologist:  Dr. Gala Romney  Pre-Procedure History & Physical: HPI:  Daniel Mcpherson is a 51 y.o. male here for diagnostic colonoscopy. Single episode of rectal bleeding recently. History of multiple colonic adenomas removed in the past couple of years. One episode of rectal bleeding;  none since the single episode he noted. He is here for diagnostic examination.  Past Medical History:  Diagnosis Date  . Anxiety   . Colon polyps   . Diverticulosis   . Hyperlipidemia   . Hypertension   . Pancreatitis    hypertriglyceridema, single episode did not require hospitalization, [facility]. Around 2000.  . Tubular adenoma     Past Surgical History:  Procedure Laterality Date  . COLONOSCOPY N/A 07/04/2013   Dr. Gala Romney- multiple rectal and colonic polyos removed. colonic dierticulosis, tubular adenoma on bx.  . COLONOSCOPY N/A 10/04/2013   Procedure: COLONOSCOPY;  Surgeon: Daneil Dolin, MD;  [facility];  Service: Endoscopy;  Laterality: N/A;  10:30 AM  . FEMORAL HERNIA REPAIR    . KNEE SURGERY     left  . SHOULDER SURGERY  11/2012   left    Prior to Admission medications   Medication Sig Start Date End Date Taking? Authorizing Provider  aspirin EC 81 MG tablet Take 81 mg by mouth daily.   Yes [provider]  cholecalciferol (VITAMIN D) 1000 UNITS tablet Take 1,000 Units by mouth daily.   Yes [provider]  escitalopram (LEXAPRO) 10 MG tablet Take 10 mg by mouth daily. 11/19/12  Yes [provider]  fenofibrate 160 MG tablet Take 160 mg by mouth daily.  05/15/13  Yes [provider]  GLUCOSAMINE-CHONDROITIN PO Take 1 tablet by mouth daily.   Yes [provider]  ibuprofen (ADVIL,MOTRIN) 200 MG tablet Take 400 mg by mouth daily as needed for headache.   Yes [provider]  lisinopril-hydrochlorothiazide (PRINZIDE,ZESTORETIC) 10-12.5 MG per tablet Take 1 tablet  by mouth daily.  05/14/13  Yes [provider]  Omega-3 Fatty Acids (FISH OIL) 1200 MG CAPS Take 1,200 mg by mouth 2 (two) times daily.   Yes [provider]  polyethylene glycol-electrolytes (TRILYTE) 420 g solution Take 4,000 mLs by mouth as directed. 06/07/16  Yes Neola Worrall, [name], MD  Probiotic Product (PROBIOTIC PO) Take 1 capsule by mouth daily.   Yes [provider]  simvastatin (ZOCOR) 80 MG tablet Take 80 mg by mouth daily at 6 PM.  05/15/13  Yes [provider]    Allergies as of 06/02/2016  . (No Known Allergies)    Family History  Problem Relation Age of Onset  . Hypercholesterolemia Mother   . Colon cancer Neg Hx   . Pancreatitis Neg Hx     Social History   Social History  . Marital status: Single    Spouse name: N/A  . Number of children: N/A  . Years of education: N/A   Occupational History  . [place] [employer]    Social History Main Topics  . Smoking status: Current Every Day Smoker    Packs/day: 0.50    Types: Cigarettes    Start date: 01/07/1982  . Smokeless tobacco: Never Used     Comment: 1/2 pack daily  . Alcohol use No  . Drug use: No  . Sexual activity: Not on file   Other Topics Concern  . Not on file  Social History Narrative  . No narrative on file    Review of Systems: See HPI, otherwise negative ROS  Physical Exam: BP (!) 132/93   Pulse 75   Temp 98.8 F (37.1 C) (Oral)   Resp 18   Ht 5\' 6"  (1.676 m)   Wt 178 lb (80.7 kg)   SpO2 95%   BMI 28.73 kg/m  General:   Alert,  Well-developed, well-nourished, pleasant and cooperative in NAD Lungs:  Clear throughout to auscultation.   No wheezes, crackles, or rhonchi. No acute distress. Heart:  Regular rate and rhythm; no murmurs, clicks, rubs,  or gallops. Abdomen: Non-distended, normal bowel sounds.  Soft and nontender without appreciable mass or hepatosplenomegaly.  Pulses:  Normal pulses  noted. Extremities:  Without clubbing or edema.  Impression:   Pleasant 51 year old gentleman history of multiple colonic adenomas removed last few years. Recent rectal bleeding. Diagnostic colonoscopy is now being done.   Recommendations:  I will offer the patient a diagnostic colonoscopy today.The risks, benefits, limitations, alternatives and imponderables have been reviewed with the patient. Questions have been answered. All parties are agreeable.   Notice: This dictation was prepared with Dragon dictation along with smaller phrase technology. Any transcriptional errors that result from this process are unintentional and may not be corrected upon review.

## 2016-07-08 NOTE — Op Note (Signed)
[facility] Patient Name: Daniel Mcpherson Procedure Date: 07/08/2016 11:04 AM MRN: [id number] Date of Birth: [birth year] Attending MD: Norvel Richards , MD CSN: [id number] Age: 51 Admit Type: Outpatient Procedure:                Colonoscopy Indications:              Hematochezia Providers:                Norvel Richards, MD, Jeanann Lewandowsky. Gwenlyn Perking RN, RN,                            Randa Spike, Technician Referring MD:              Medicines:                Midazolam 7 mg IV, Meperidine 100 mg IV,                            Ondansetron 4 mg IV Complications:            No immediate complications. Estimated Blood Loss:     Estimated blood loss was minimal. Procedure:                Pre-Anesthesia Assessment:                           - Prior to the procedure, a History and Physical                            was performed, and patient medications and                            allergies were reviewed. The patient's tolerance of                            previous anesthesia was also reviewed. The risks                            and benefits of the procedure and the sedation                            options and risks were discussed with the patient.                            All questions were answered, and informed consent                            was obtained. Prior Anticoagulants: The patient has                            taken no previous anticoagulant or antiplatelet                            agents. ASA Grade Assessment: II - A patient with  mild systemic disease. After reviewing the risks                            and benefits, the patient was deemed in                            satisfactory condition to undergo the procedure.                           After obtaining informed consent, the colonoscope                            was passed under direct vision. Throughout the                            procedure, the patient's blood  pressure, pulse, and                            oxygen saturations were monitored continuously. The                            EC-[id number] ([id number]) scope was introduced through                            the anus and advanced to the the cecum, identified                            by appendiceal orifice and ileocecal valve. The                            ileocecal valve, appendiceal orifice, and rectum                            were photographed. The colonoscopy was performed                            without difficulty. The patient tolerated the                            procedure well. The quality of the bowel                            preparation was adequate. The entire colon was well                            visualized. The ileocecal valve, appendiceal                            orifice, and rectum were photographed. Scope In: 11:19:10 AM Scope Out: 11:32:02 AM Scope Withdrawal Time: 0 hours 9 minutes 38 seconds  Total Procedure Duration: 0 hours 12 minutes 52 seconds  Findings:      The perianal and digital rectal examinations were normal.      Non-bleeding internal hemorrhoids were found during retroflexion. The  hemorrhoids were Grade I (internal hemorrhoids that do not prolapse).      Scattered small and large-mouthed diverticula were found in the sigmoid       colon and descending colon.      A 5 mm polyp was found in the splenic flexure. The polyp was       semi-pedunculated. The polyp was removed with a cold snare. Resection       and retrieval were complete. Estimated blood loss was minimal.      The exam was otherwise without abnormality on direct and retroflexion       views. Impression:               - Non-bleeding internal hemorrhoids.                           - Diverticulosis in the sigmoid colon and in the                            descending colon.                           - One 5 mm polyp at the splenic flexure, removed                            with  a cold snare. Resected and retrieved.                           - The examination was otherwise normal on direct                            and retroflexion views. Moderate Sedation:      Moderate (conscious) sedation was administered by the endoscopy nurse       and supervised by the endoscopist. The following parameters were       monitored: oxygen saturation, heart rate, blood pressure, respiratory       rate, EKG, adequacy of pulmonary ventilation, and response to care.       Total physician intraservice time was 15 minutes.      Moderate (conscious) sedation was administered by the endoscopy nurse       and supervised by the endoscopist. The following parameters were       monitored: oxygen saturation, heart rate, blood pressure, respiratory       rate, EKG, adequacy of pulmonary ventilation, and response to care.       Total physician intraservice time was 22 minutes. Recommendation:           - Patient has a contact number available for                            emergencies. The signs and symptoms of potential                            delayed complications were discussed with the                            patient. Return to normal activities tomorrow.  Written discharge instructions were provided to the                            patient.                           - Resume previous diet.                           - Continue present medications.                           - Repeat colonoscopy date to be determined after                            pending pathology results are reviewed for                            surveillance based on pathology results.                           - Return to GI office (date not yet determined). Procedure Code(s):        --- Professional ---                           [phone], Colonoscopy, flexible; with removal of                            tumor(s), polyp(s), or other lesion(s) by snare                             technique                           99152, Moderate sedation services provided by the                            same physician or other qualified health care                            professional performing the diagnostic or                            therapeutic service that the sedation supports,                            requiring the presence of an independent trained                            observer to assist in the monitoring of the                            patient's level of consciousness and physiological                            status; initial 15  minutes of intraservice time,                            patient age 88 years or older                           [phone], Moderate sedation services provided by the                            same physician or other qualified health care                            professional performing the diagnostic or                            therapeutic service that the sedation supports,                            requiring the presence of an independent trained                            observer to assist in the monitoring of the                            patient's level of consciousness and physiological                            status; initial 15 minutes of intraservice time,                            patient age 27 years or older Diagnosis Code(s):        --- Professional ---                           K64.0, First degree hemorrhoids                           D12.3, Benign neoplasm of transverse colon (hepatic                            flexure or splenic flexure)                           K92.1, Melena (includes Hematochezia)                           K57.30, Diverticulosis of large intestine without                            perforation or abscess without bleeding CPT copyright 2016 American Medical Association. All rights reserved. The codes documented in this report are preliminary and upon coder review may  be revised to  meet current compliance requirements. [name]. Lorma Heater, MD Norvel Richards, MD 07/08/2016 11:45:42 AM This report has been signed electronically. Number of Addenda: 0

## 2016-07-10 ENCOUNTER — Encounter: Payer: Self-pay | Admitting: Internal Medicine

## 2016-07-15 ENCOUNTER — Encounter (HOSPITAL_COMMUNITY): Payer: Self-pay | Admitting: Internal Medicine

## 2016-11-23 ENCOUNTER — Ambulatory Visit (INDEPENDENT_AMBULATORY_CARE_PROVIDER_SITE_OTHER): Payer: No Typology Code available for payment source | Admitting: Urology

## 2016-11-23 DIAGNOSIS — N400 Enlarged prostate without lower urinary tract symptoms: Secondary | ICD-10-CM

## 2016-11-23 DIAGNOSIS — R972 Elevated prostate specific antigen [PSA]: Secondary | ICD-10-CM | POA: Diagnosis not present

## 2017-03-03 DIAGNOSIS — N201 Calculus of ureter: Secondary | ICD-10-CM | POA: Diagnosis not present

## 2017-03-03 DIAGNOSIS — R310 Gross hematuria: Secondary | ICD-10-CM | POA: Diagnosis not present

## 2018-03-06 DIAGNOSIS — R7309 Other abnormal glucose: Secondary | ICD-10-CM | POA: Diagnosis not present

## 2018-03-06 DIAGNOSIS — I10 Essential (primary) hypertension: Secondary | ICD-10-CM | POA: Diagnosis not present

## 2018-03-06 DIAGNOSIS — Z6828 Body mass index (BMI) 28.0-28.9, adult: Secondary | ICD-10-CM | POA: Diagnosis not present

## 2018-03-06 DIAGNOSIS — Z1389 Encounter for screening for other disorder: Secondary | ICD-10-CM | POA: Diagnosis not present

## 2018-03-06 DIAGNOSIS — Z0000 Encounter for general adult medical examination without abnormal findings: Secondary | ICD-10-CM | POA: Diagnosis not present

## 2018-03-06 DIAGNOSIS — E782 Mixed hyperlipidemia: Secondary | ICD-10-CM | POA: Diagnosis not present

## 2018-03-06 DIAGNOSIS — R739 Hyperglycemia, unspecified: Secondary | ICD-10-CM | POA: Diagnosis not present

## 2019-05-01 ENCOUNTER — Telehealth: Payer: Self-pay | Admitting: Internal Medicine

## 2019-05-01 NOTE — Telephone Encounter (Signed)
Spoke with pt. He has an apt on Thursday morning. He is going to try otc Miralax prior to his appointment. Pt asked it it was ok to try. Pt can follow the directions on the bottle prior to appt.

## 2019-05-01 NOTE — Telephone Encounter (Signed)
PLEASE CALL PATIENT ABOUT HIS CONSTIPATION    HE IS SCHEDULED FOR APPOINTMENT THIS WEEK BUT HAS QUESTIONS

## 2019-05-03 ENCOUNTER — Ambulatory Visit (HOSPITAL_COMMUNITY)
Admission: RE | Admit: 2019-05-03 | Discharge: 2019-05-03 | Disposition: A | Discharge status: Home / Self Care | Payer: BC Managed Care – PPO | Source: Ambulatory Visit | Attending: Nurse Practitioner | Admitting: Nurse Practitioner

## 2019-05-03 ENCOUNTER — Encounter: Payer: Self-pay | Admitting: Nurse Practitioner

## 2019-05-03 ENCOUNTER — Ambulatory Visit: Payer: BC Managed Care – PPO | Admitting: Nurse Practitioner

## 2019-05-03 ENCOUNTER — Other Ambulatory Visit: Payer: Self-pay

## 2019-05-03 DIAGNOSIS — R1030 Lower abdominal pain, unspecified: Secondary | ICD-10-CM | POA: Diagnosis not present

## 2019-05-03 DIAGNOSIS — K5900 Constipation, unspecified: Secondary | ICD-10-CM

## 2019-05-03 DIAGNOSIS — R109 Unspecified abdominal pain: Secondary | ICD-10-CM | POA: Insufficient documentation

## 2019-05-03 NOTE — Progress Notes (Signed)
Referring Provider: Redmond School, MD Primary Care Physician:  Redmond School, MD Primary GI:  Dr. Gala Romney  Chief Complaint  Patient presents with  . Abdominal Pain    episode saturday, not constant now, comes/goes  . Bloated  . Constipation    used miralax tuesday and had 1 episode of diarrhea, and used last night but didn't seem to help    HPI:   Daniel Mcpherson is a 54 y.o. male who presents for evaluation of bloating and abdominal pain.  The patient was last seen in our office 06/02/2016 for rectal bleeding and history of colon polyps.  History of multiple colon polyps in May 2015 and specifically 2 of those polyps were greater than a centimeter with a recommended 3 to 48-month repeat.  On his repeat exam there is a single 4 mm polyp in the ascending colon found to be tubular adenoma with a recommended 3-year repeat exam.  At his last visit he noted 2 weeks of sensation of needing to have a bowel movement with mild lower abdominal tenderness.  He had 2 episodes of rectal bleeding and none after that, some residual left lower quadrant discomfort that resolved over several days.  No other overt GI complaints.  No history of known hemorrhoids.  Recommended colonoscopy.  Updated colonoscopy completed 07/08/2016 which found nonbleeding internal hemorrhoids, diverticulosis in the sigmoid and descending colon, single 5 mm polyp at the splenic flexure, otherwise normal.  Surgical pathology found the polyp to be tubular adenoma and recommended 5-year repeat colonoscopy.  Today he states he's doing ok overall. Has had some worsening constipation and bloating recently. Tried MiraLAX x a couple doses; one resulted in some loose stools and the next time didn't help much. Has associated intermittent abdominal pain located lower abdomen and described as "discomfort". His pain does not resolve after a bowel movement. The constant symptom is bloating. His pain "is on the level of gas pain, but doesn't remind  me of gas pain."  These symptoms have been relatively infrequent until recently. Has a bowel movement about daily, stools are small "like little fingerlings" and tries to not strain. Denies hematochezia, melena, fever, chills, unintentional weight loss. Drinks a lot of water daily, has been eating more fiber recently. Denies URI or flu-like symptoms. Denies loss of sense of taste or smell. Never tested for COVID-19. Denies chest pain, dyspnea, dizziness, lightheadedness, syncope, near syncope. Denies any other upper or lower GI symptoms.  Past Medical History:  Diagnosis Date  . Anxiety   . Colon polyps   . Diverticulosis   . Hyperlipidemia   . Hypertension   . Pancreatitis    hypertriglyceridema, single episode did not require hospitalization, [facility]. Around 2000.  . Tubular adenoma     Past Surgical History:  Procedure Laterality Date  . COLONOSCOPY N/A 07/04/2013   Dr. Gala Romney- multiple rectal and colonic polyos removed. colonic dierticulosis, tubular adenoma on bx.  . COLONOSCOPY N/A 10/04/2013   Procedure: COLONOSCOPY;  Surgeon: Daneil Dolin, MD;  [facility];  Service: Endoscopy;  Laterality: N/A;  10:30 AM  . COLONOSCOPY N/A 07/08/2016   Procedure: COLONOSCOPY;  Surgeon: Daneil Dolin, MD;  Location: [facility];  Service: Endoscopy;  Laterality: N/A;  10:30am  . FEMORAL HERNIA REPAIR    . KNEE SURGERY     left  . POLYPECTOMY  07/08/2016   Procedure: POLYPECTOMY;  Surgeon: Daneil Dolin, MD;  Location: [facility];  Service: Endoscopy;;  colon  . SHOULDER SURGERY  11/2012   left    Current Outpatient Medications  Medication Sig Dispense Refill  . aspirin EC 81 MG tablet Take 81 mg by mouth daily.    . cholecalciferol (VITAMIN D) 1000 UNITS tablet Take 1,000 Units by mouth daily.    Marland Kitchen escitalopram (LEXAPRO) 10 MG tablet Take 10 mg by mouth daily.    . fenofibrate 160 MG tablet Take 160 mg by mouth daily.     Marland Kitchen GLUCOSAMINE-CHONDROITIN PO Take 1 tablet by  mouth daily.    Marland Kitchen ibuprofen (ADVIL,MOTRIN) 200 MG tablet Take 400 mg by mouth daily as needed for headache.    . lisinopril-hydrochlorothiazide (PRINZIDE,ZESTORETIC) 10-12.5 MG per tablet Take 1 tablet by mouth daily.     . Omega-3 Fatty Acids (FISH OIL) 1200 MG CAPS Take 1,200 mg by mouth 2 (two) times daily.    . Probiotic Product (PROBIOTIC PO) Take 1 capsule by mouth daily.    . simvastatin (ZOCOR) 80 MG tablet Take 80 mg by mouth daily at 6 PM.      No current facility-administered medications for this visit.    Allergies as of 05/03/2019  . (No Known Allergies)    Family History  Problem Relation Age of Onset  . Hypercholesterolemia Mother   . Colon cancer Neg Hx   . Pancreatitis Neg Hx     Social History   Socioeconomic History  . Marital status: Single    Spouse name: Not on file  . Number of children: Not on file  . Years of education: Not on file  . Highest education level: Not on file  Occupational History  . Occupation: Environmental education officer: [employer]   Tobacco Use  . Smoking status: Current Every Day Smoker    Packs/day: 0.50    Types: Cigarettes    Start date: 01/07/1982  . Smokeless tobacco: Never Used  . Tobacco comment: 1/2 pack daily  Substance and Sexual Activity  . Alcohol use: No  . Drug use: No  . Sexual activity: Not on file  Other Topics Concern  . Not on file  Social History Narrative  . Not on file   Social Determinants of Health   Financial Resource Strain:   . Difficulty of Paying Living Expenses: Not on file  Food Insecurity:   . Worried About Charity fundraiser in the Last Year: Not on file  . Ran Out of Food in the Last Year: Not on file  Transportation Needs:   . Lack of Transportation (Medical): Not on file  . Lack of Transportation (Non-Medical): Not on file  Physical Activity:   . Days of Exercise per Week: Not on file  . Minutes of Exercise per Session: Not on file  Stress:    . Feeling of Stress : Not on file  Social Connections:   . Frequency of Communication with Friends and Family: Not on file  . Frequency of Social Gatherings with Friends and Family: Not on file  . Attends Religious Services: Not on file  . Active Member of Clubs or Organizations: Not on file  . Attends Archivist Meetings: Not on file  . Marital Status: Not on file    Review of Systems: General: Negative for anorexia, weight loss, fever, chills, fatigue, weakness. ENT: Negative for hoarseness, difficulty swallowing. CV: Negative for chest pain, angina, palpitations, peripheral edema.  Respiratory: Negative for dyspnea at rest, cough, sputum, wheezing.  GI: See history of present illness. Endo: Negative for unusual weight change.  Heme: Negative for bruising or bleeding. Allergy: Negative for rash or hives.   Physical Exam: BP 122/83   Pulse 97   Temp (!) 96.9 F (36.1 C) (Oral)   Ht 5\' 6"  (1.676 m)   Wt 174 lb 9.6 oz (79.2 kg)   BMI 28.18 kg/m  General:   Alert and oriented. Pleasant and cooperative. Well-nourished and well-developed.  Eyes:  Without icterus, sclera clear and conjunctiva pink.  Ears:  Normal auditory acuity. Cardiovascular:  S1, S2 present without murmurs appreciated. Extremities without clubbing or edema. Respiratory:  Clear to auscultation bilaterally. No wheezes, rales, or rhonchi. No distress.  Gastrointestinal:  +BS, soft, non-tender and non-distended. No HSM noted. No guarding or rebound. No masses appreciated.  Rectal:  Deferred  Musculoskalatal:  Symmetrical without gross deformities. Neurologic:  Alert and oriented x4;  grossly normal neurologically. Psych:  Alert and cooperative. Normal mood and affect. Heme/Lymph/Immune: No excessive bruising noted.    05/03/2019 9:59 AM   Disclaimer: This note was dictated with voice recognition software. Similar sounding words can inadvertently be transcribed and may not be corrected upon  review.

## 2019-05-03 NOTE — Assessment & Plan Note (Signed)
Lower abdominal pain described as "discomfort" associated with bloating, mild constipation.  This is all likely unrelated.  I feel can get his constipation better controlled and have him bed better, more complete bowel movements that his abdominal pain and bloating improved.  Further constipation management as per above.  Request progress report in 3 to 4 weeks and follow-up in 2 months.

## 2019-05-03 NOTE — Assessment & Plan Note (Signed)
The patient appears to have some mild constipation.  While he does have a bowel movement daily, his stools are often small "like little finger links" although he tries not to strain.  Has associated bloating, abdominal distention at times, lower abdominal discomfort.  He has tried MiraLAX which helped once and did not help another time.  He only has done this intermittently.  I will check an abdominal x-ray for significant stool burden.  After is completed I will have him start Colace 100 mg daily and after a week, if still not going well, can add MiraLAX once a day or up to twice a day as needed.  I requested a progress report in 3 to 4 weeks and follow-up in 2 months.

## 2019-05-03 NOTE — Patient Instructions (Signed)
Your health issues we discussed today were:   Constipation, abdominal discomfort, and bloating: 1. I feel these are all likely related.  If we can get you having better, more complete bowel movements your other symptoms will likely improve significantly 2. Have your abdominal x-ray completed as soon as you can.  You should not need an appointment for this at [facility] 3. After you complete your x-ray, start taking Colace 100 mg (over the counter stool softener). Take this once a day, every day 4. After a week if you are still not having better, more complete bowel movements you can add MiraLAX once a day.  You can increase MiraLAX to twice a day as needed 5. If you have loose stools, especially more than 2-3 episodes, pull back on your medications and let us know 6. Call us in 3 to 4 weeks and let us know how you are doing 7. Call us for any worsening or severe symptoms  Overall I recommend:  1. Continue your other current medications 2. Return for follow-up in 2 months 3. Call us if you have any questions or concerns.   ---------------------------------------------------------------  COVID-19 Vaccine Information can be found at: [email or web address] For questions related to vaccine distribution or appointments, please email [email or web address] or call [phone].   ---------------------------------------------------------------   At [facility] we value your feedback. You may receive a survey about your visit today. Please share your experience as we strive to create trusting relationships with our patients to provide genuine, compassionate, quality care.  We appreciate your understanding and patience as we review any laboratory studies, imaging, and other diagnostic tests that are ordered as we care for you. Our office policy is 5 business days for review of these results, and any emergent or urgent  results are addressed in a timely manner for your best interest. If you do not hear from our office in 1 week, please contact us.   We also encourage the use of MyChart, which contains your medical information for your review as well. If you are not enrolled in this feature, an access code is on this after visit summary for your convenience. Thank you for allowing [place] to be involved in your care.  It was great to see you today!  I hope you have a great day!!

## 2019-06-12 DIAGNOSIS — I10 Essential (primary) hypertension: Secondary | ICD-10-CM | POA: Diagnosis not present

## 2019-06-12 DIAGNOSIS — Z1389 Encounter for screening for other disorder: Secondary | ICD-10-CM | POA: Diagnosis not present

## 2019-06-12 DIAGNOSIS — Z0000 Encounter for general adult medical examination without abnormal findings: Secondary | ICD-10-CM | POA: Diagnosis not present

## 2019-06-12 DIAGNOSIS — Z6828 Body mass index (BMI) 28.0-28.9, adult: Secondary | ICD-10-CM | POA: Diagnosis not present

## 2019-06-12 DIAGNOSIS — R7309 Other abnormal glucose: Secondary | ICD-10-CM | POA: Diagnosis not present

## 2019-06-12 DIAGNOSIS — E7849 Other hyperlipidemia: Secondary | ICD-10-CM | POA: Diagnosis not present

## 2019-07-10 ENCOUNTER — Encounter: Payer: Self-pay | Admitting: Nurse Practitioner

## 2019-07-10 ENCOUNTER — Encounter: Payer: Self-pay | Admitting: Internal Medicine

## 2019-07-10 ENCOUNTER — Other Ambulatory Visit: Payer: Self-pay

## 2019-07-10 ENCOUNTER — Ambulatory Visit (INDEPENDENT_AMBULATORY_CARE_PROVIDER_SITE_OTHER): Payer: BC Managed Care – PPO | Admitting: Nurse Practitioner

## 2019-07-10 DIAGNOSIS — K5900 Constipation, unspecified: Secondary | ICD-10-CM

## 2019-07-10 DIAGNOSIS — R1030 Lower abdominal pain, unspecified: Secondary | ICD-10-CM

## 2019-07-10 NOTE — Progress Notes (Signed)
Referring Provider: Redmond School, MD Primary Care Physician:  Redmond School, MD Primary GI:  Dr. Gala Romney  NOTE: Service was provided via telemedicine and was requested by the patient due to COVID-19 pandemic.  Patient Location: Home  Provider Location: [place] office  Reason for Phone Visit: Follow-up  Persons present on the phone encounter, with roles: Patient, myself (provider),Mindy Quincy Simmonds (updated meds and allergies)  Total time (minutes) spent on medical discussion: 15 minutes  Due to COVID-19, visit was conducted using telephonic method (no video was available).  Visit was requested by patient.  Virtual Visit via Telephone only  I connected with Daniel Mcpherson on 07/10/19 at  8:00 AM EDT by Telephone and verified that I am speaking with the correct person using two identifiers.   I discussed the limitations, risks, security and privacy concerns of performing an evaluation and management service by telephone and the availability of in person appointments. I also discussed with the patient that there may be a patient responsible charge related to this service. The patient expressed understanding and agreed to proceed.  Chief Complaint  Patient presents with  . Abdominal Pain    none at this time  . Constipation    none in a while    HPI:   Daniel Mcpherson is a 54 y.o. male who presents for virtual visit regarding: Constipation and abdominal pain.  The patient was last seen in our office 05/03/2019 for the same.  Noted history of colon polyps, multiple, in May 2015 with 2 of those greater than a centimeter and recommended 3 to 78-month repeat.  On repeat exam there is a single 4 mm polyp and recommended a 3-year repeat exam.  He did undergo his 3-year repeat on 07/08/2016 with nonbleeding internal hemorrhoids, diverticulosis, a single polyp found to be tubular adenoma and recommended a 5-year repeat exam (2023).  At his last visit noted some worsening constipation and bloating despite  MiraLAX for couple of doses.  1 dose resulted in loose stools and the next one did not help much.  Lower abdominal pain described as discomfort that does not resolve after bowel movement.  However, his pain does typically resolve but is persistent symptom complaint was bloating.  His pain is not severe and noted to be "on the level of gas pain".  Relatively infrequent until recently.  No straining, small stools "like little finger lungs".  No other overt GI complaints.  Recommended abdominal x-ray, start Colace 100 mg daily, add MiraLAX 1-2 times a day as needed, progress report in 3 to 4 weeks.  Follow-up in 2 months.  Today he states he's doing good overall. Constipation has been resolved for quite a while. Taking Colace daily, but not needing MiraLAX. Drinking quite a bit of water daily. Eats a lot more fiber foods over the past couple of months. Pain has resolved as well. Denies hematochezia, melena, N/V, fever, chills, unintentional weight loss. Denies URI or flu-like symptoms. Denies loss of sense of taste or smell. The patient has not received COVID-19 vaccination(s). They are not interested in more information. Denies chest pain, dyspnea, dizziness, lightheadedness, syncope, near syncope. Denies any other upper or lower GI symptoms.   Past Medical History:  Diagnosis Date  . Anxiety   . Colon polyps   . Diverticulosis   . Hyperlipidemia   . Hypertension   . Pancreatitis    hypertriglyceridema, single episode did not require hospitalization, [facility]. Around 2000.  . Tubular adenoma  Past Surgical History:  Procedure Laterality Date  . COLONOSCOPY N/A 07/04/2013   Dr. Gala Romney- multiple rectal and colonic polyos removed. colonic dierticulosis, tubular adenoma on bx.  . COLONOSCOPY N/A 10/04/2013   Procedure: COLONOSCOPY;  Surgeon: Daneil Dolin, MD;  [facility];  Service: Endoscopy;  Laterality: N/A;  10:30 AM  . COLONOSCOPY N/A 07/08/2016   Procedure: COLONOSCOPY;  Surgeon:  Daneil Dolin, MD;  [facility];  Service: Endoscopy;  Laterality: N/A;  10:30am  . FEMORAL HERNIA REPAIR    . KNEE SURGERY     left  . POLYPECTOMY  07/08/2016   Procedure: POLYPECTOMY;  Surgeon: Daneil Dolin, MD;  [facility];  Service: Endoscopy;;  colon  . SHOULDER SURGERY  11/2012   left    Current Outpatient Medications  Medication Sig Dispense Refill  . aspirin EC 81 MG tablet Take 81 mg by mouth daily.    . cholecalciferol (VITAMIN D) 1000 UNITS tablet Take 1,000 Units by mouth daily.    Marland Kitchen docusate sodium (COLACE) 100 MG capsule Take 100 mg by mouth daily.    Marland Kitchen escitalopram (LEXAPRO) 10 MG tablet Take 10 mg by mouth daily.    . fenofibrate 160 MG tablet Take 160 mg by mouth daily.     Marland Kitchen GLUCOSAMINE-CHONDROITIN PO Take 1 tablet by mouth daily.    Marland Kitchen ibuprofen (ADVIL,MOTRIN) 200 MG tablet Take 400 mg by mouth daily as needed for headache.    . lisinopril-hydrochlorothiazide (PRINZIDE,ZESTORETIC) 10-12.5 MG per tablet Take 1 tablet by mouth daily.     . Omega-3 Fatty Acids (FISH OIL) 1200 MG CAPS Take 1,200 mg by mouth 2 (two) times daily.    . Probiotic Product (PROBIOTIC PO) Take 1 capsule by mouth daily.    . simvastatin (ZOCOR) 80 MG tablet Take 80 mg by mouth daily at 6 PM.      No current facility-administered medications for this visit.    Allergies as of 07/10/2019  . (No Known Allergies)    Family History  Problem Relation Age of Onset  . Hypercholesterolemia Mother   . Colon cancer Neg Hx   . Pancreatitis Neg Hx     Social History   Socioeconomic History  . Marital status: Single    Spouse name: Not on file  . Number of children: Not on file  . Years of education: Not on file  . Highest education level: Not on file  Occupational History  . Occupation: Environmental education officer: [employer]   Tobacco Use  . Smoking status: Current Every Day Smoker    Packs/day: 0.50    Types:  Cigarettes    Start date: 01/07/1982  . Smokeless tobacco: Never Used  . Tobacco comment: 1/2 pack daily  Substance and Sexual Activity  . Alcohol use: No  . Drug use: No  . Sexual activity: Not on file  Other Topics Concern  . Not on file  Social History Narrative  . Not on file   Social Determinants of Health   Financial Resource Strain:   . Difficulty of Paying Living Expenses:   Food Insecurity:   . Worried About Charity fundraiser in the Last Year:   . Arboriculturist in the Last Year:   Transportation Needs:   . Film/video editor (Medical):   Marland Kitchen Lack of Transportation (Non-Medical):   Physical Activity:   . Days of Exercise per Week:   .  Minutes of Exercise per Session:   Stress:   . Feeling of Stress :   Social Connections:   . Frequency of Communication with Friends and Family:   . Frequency of Social Gatherings with Friends and Family:   . Attends Religious Services:   . Active Member of Clubs or Organizations:   . Attends Archivist Meetings:   Marland Kitchen Marital Status:     Review of Systems: Review of Systems  Constitutional: Negative for chills, fever, malaise/fatigue and weight loss.  HENT: Negative for congestion and sore throat.   Respiratory: Negative for cough and shortness of breath.   Cardiovascular: Negative for chest pain and palpitations.  Gastrointestinal: Negative for abdominal pain, blood in stool, constipation, diarrhea, melena, nausea and vomiting.  Neurological: Negative for dizziness and weakness.  Psychiatric/Behavioral: Negative for depression. The patient is not nervous/anxious.   All other systems reviewed and are negative.   Physical Exam: Note: limited exam due to virtual visit There were no vitals taken for this visit. Physical Exam Nursing note reviewed.  Constitutional:      General: He is not in acute distress.    Comments: No obvious distress detected by conversation  Pulmonary:     Effort: No respiratory  distress.     Comments: Conversational without obvious audible dyspnea Neurological:     General: No focal deficit present.     Mental Status: He is alert and oriented to person, place, and time. Mental status is at baseline.  Psychiatric:        Mood and Affect: Mood normal.        Behavior: Behavior normal.        Thought Content: Thought content normal.

## 2019-07-10 NOTE — Assessment & Plan Note (Signed)
Abdominal pain has improved/resolved since essential resolution of his constipation.  Recommend he continue to monitor for any worsening symptoms and notify us of any.  Follow-up in 6 months otherwise.

## 2019-07-10 NOTE — Patient Instructions (Signed)
Your health issues we discussed today were:   Constipation with abdominal pain: 1. I am glad you are feeling better [place] 2. Continue to drink plenty of water, eat plenty of fiber 3. Continue Colace 100 mg once daily 4. You can still use MiraLAX, if needed for breakthrough constipation, 1-2 times a day 5. Call us if you have any worsening or severe symptoms  Overall I recommend:  1. Continue your other current medications 2. Return for follow-up in 6 months 3. Call us if you have any questions or concerns.   ---------------------------------------------------------------  COVID-19 Vaccine Information can be found at: [email or web address].uk For questions related to vaccine distribution or appointments, please email [email or web address] .[email or web address] or call [phone].   ---------------------------------------------------------------   At [facility] we value your feedback. You may receive a survey about your visit today. Please share your experience as we strive to create trusting relationships with our patients to provide genuine, compassionate, quality care.  We appreciate your understanding and patience as we review any laboratory studies, imaging, and other diagnostic tests that are ordered as we care for you. Our office policy is 5 business days for review of these results, and any emergent or urgent results are addressed in a timely manner for your best interest. If you do not hear from our office in 1 week, please contact us.   We also encourage the use of MyChart, which contains your medical information for your review as well. If you are not enrolled in this feature, an access code is on this after visit summary for your convenience. Thank you for allowing [place] to be involved in your care.  It was great to see you today!  I hope you have a great Summer!!

## 2019-07-10 NOTE — Assessment & Plan Note (Signed)
Constipation essentially resolved at this time, no symptoms for a couple months with increased water intake, increase fiber, Colace daily.  Recommend he continue to monitor, notify us of any changes or worsening symptoms.  No red flags noted today.  Follow-up in 6 months.

## 2019-07-25 ENCOUNTER — Telehealth: Payer: Self-pay | Admitting: *Deleted

## 2019-07-25 NOTE — Telephone Encounter (Signed)
Daniel Mcpherson, you are scheduled for a virtual visit with your provider today.  Just as we do with appointments in the office, we must obtain your consent to participate.  Your consent will be active for this visit and any virtual visit you may have with one of our providers in the next 365 days.  If you have a MyChart account, I can also send a copy of this consent to you electronically.  All virtual visits are billed to your insurance company just like a traditional visit in the office.  As this is a virtual visit, video technology does not allow for your provider to perform a traditional examination.  This may limit your provider's ability to fully assess your condition.  If your provider identifies any concerns that need to be evaluated in person or the need to arrange testing such as labs, EKG, etc, we will make arrangements to do so.  Although advances in technology are sophisticated, we cannot ensure that it will always work on either your end or our end.  If the connection with a video visit is poor, we may have to switch to a telephone visit.  With either a video or telephone visit, we are not always able to ensure that we have a secure connection.   I need to obtain your verbal consent now.   Are you willing to proceed with your visit today?

## 2019-07-25 NOTE — Telephone Encounter (Signed)
Pt consented to a telephone visit on 07/10/19. 

## 2019-10-15 DIAGNOSIS — Z1159 Encounter for screening for other viral diseases: Secondary | ICD-10-CM | POA: Diagnosis not present

## 2019-10-15 DIAGNOSIS — I10 Essential (primary) hypertension: Secondary | ICD-10-CM | POA: Diagnosis not present

## 2019-10-15 DIAGNOSIS — E663 Overweight: Secondary | ICD-10-CM | POA: Diagnosis not present

## 2019-10-15 DIAGNOSIS — F17290 Nicotine dependence, other tobacco product, uncomplicated: Secondary | ICD-10-CM | POA: Diagnosis not present

## 2019-10-15 DIAGNOSIS — Z6828 Body mass index (BMI) 28.0-28.9, adult: Secondary | ICD-10-CM | POA: Diagnosis not present

## 2019-12-17 DIAGNOSIS — J069 Acute upper respiratory infection, unspecified: Secondary | ICD-10-CM | POA: Diagnosis not present

## 2020-01-10 ENCOUNTER — Encounter: Payer: Self-pay | Admitting: Nurse Practitioner

## 2020-01-10 ENCOUNTER — Ambulatory Visit: Payer: BC Managed Care – PPO | Admitting: Nurse Practitioner

## 2020-01-10 ENCOUNTER — Other Ambulatory Visit: Payer: Self-pay

## 2020-01-10 VITALS — BP 120/83 | HR 89 | Temp 96.9°F | Ht 66.0 in | Wt 175.6 lb

## 2020-01-10 DIAGNOSIS — K5900 Constipation, unspecified: Secondary | ICD-10-CM

## 2020-01-10 DIAGNOSIS — R1030 Lower abdominal pain, unspecified: Secondary | ICD-10-CM

## 2020-01-10 NOTE — Progress Notes (Signed)
Referring Provider: Redmond School, MD Primary Care Physician:  Redmond School, MD Primary GI:  Dr. Gala Romney  Chief Complaint  Patient presents with  . Constipation    f/u. not having much of an issue right now. Last week he started feeling back to normal. BM's daily  . Abdominal Pain    f/u. none as of now    HPI:   Daniel Mcpherson is a 54 y.o. male who presents for follow-up on constipation and abdominal pain.  The patient was last seen in our office 07/10/2019 for the same.  The patient's last visit was a virtual office visit due to COVID-19/coronavirus pandemic.  Previous colonoscopy in May 2015 with 2 polyps greater than a centimeter and recommended 3 to 60-month repeat and on repeat there is a single 5 mm polyp with recommended 3-year repeat.  He did undergo his repeat exam 07/08/2016 with nonbleeding internal hemorrhoids, single polyp that was tubular adenoma and recommended 5-year repeat in 2023.  At his last visit constipation resolved for quite some time on Colace daily, has not needed MiraLAX in a while.  Drinking a lot of water and a lot of fiber over the past few months.  No overt GI or hepatic complaints.  Recommended continue increased fiber and water, Colace daily, MiraLAX as needed, follow-up in 6 months.  Today states doing okay overall. Bowel have normalized on Colace and probiotic, no issues with constipation at this point. Has also had resolution of abdominal pain with improvement in constipation. Has a bowel movement about twice a day, consistent with [place] 4. He did get the COVID-19 vaccine 11/06/19 and had a lot of sinus pain which self-resolved. With the second shot had a bad sinus headache. About 12 days later was having abdominal discomfort and went to the doctor the next day (virtual visit) and was given an antibiotic which really cause GI distress. Denies N/V, hematochezia, melena, fever, chills, unintentional weight loss. Denies URI or flu-like symptoms. Denies loss of  sense of taste or smell. The patient has received COVID-19 vaccination(s). Denies chest pain, dyspnea, dizziness, lightheadedness, syncope, near syncope. Denies any other upper or lower GI symptoms.  Past Medical History:  Diagnosis Date  . Anxiety   . Colon polyps   . Diverticulosis   . Hyperlipidemia   . Hypertension   . Pancreatitis    hypertriglyceridema, single episode did not require hospitalization, [facility]. Around 2000.  . Tubular adenoma     Past Surgical History:  Procedure Laterality Date  . COLONOSCOPY N/A 07/04/2013   Dr. Gala Romney- multiple rectal and colonic polyos removed. colonic dierticulosis, tubular adenoma on bx.  . COLONOSCOPY N/A 10/04/2013   Procedure: COLONOSCOPY;  Surgeon: Daneil Dolin, MD;  [facility];  Service: Endoscopy;  Laterality: N/A;  10:30 AM  . COLONOSCOPY N/A 07/08/2016   Procedure: COLONOSCOPY;  Surgeon: Daneil Dolin, MD;  [facility];  Service: Endoscopy;  Laterality: N/A;  10:30am  . FEMORAL HERNIA REPAIR    . KNEE SURGERY     left  . POLYPECTOMY  07/08/2016   Procedure: POLYPECTOMY;  Surgeon: Daneil Dolin, MD;  [facility];  Service: Endoscopy;;  colon  . SHOULDER SURGERY  11/2012   left    Current Outpatient Medications  Medication Sig Dispense Refill  . aspirin EC 81 MG tablet Take 81 mg by mouth daily.    . cholecalciferol (VITAMIN D) 1000 UNITS tablet Take 1,000 Units by mouth daily.    Marland Kitchen  docusate sodium (COLACE) 100 MG capsule Take 100 mg by mouth daily.    Marland Kitchen escitalopram (LEXAPRO) 10 MG tablet Take 10 mg by mouth daily.    . fenofibrate 160 MG tablet Take 160 mg by mouth daily.     Marland Kitchen GLUCOSAMINE-CHONDROITIN PO Take 1 tablet by mouth daily.    Marland Kitchen ibuprofen (ADVIL,MOTRIN) 200 MG tablet Take 400 mg by mouth daily as needed for headache.    . lisinopril-hydrochlorothiazide (PRINZIDE,ZESTORETIC) 10-12.5 MG per tablet Take 1 tablet by mouth daily.     . Omega-3 Fatty Acids (FISH OIL) 1200 MG CAPS  Take 1,200 mg by mouth 2 (two) times daily.    . Probiotic Product (PROBIOTIC PO) Take 1 capsule by mouth daily.    . simvastatin (ZOCOR) 80 MG tablet Take 80 mg by mouth daily at 6 PM.      No current facility-administered medications for this visit.    Allergies as of 01/10/2020  . (No Known Allergies)    Family History  Problem Relation Age of Onset  . Hypercholesterolemia Mother   . Colon cancer Neg Hx   . Pancreatitis Neg Hx     Social History   Socioeconomic History  . Marital status: Single    Spouse name: Not on file  . Number of children: Not on file  . Years of education: Not on file  . Highest education level: Not on file  Occupational History  . Occupation: Environmental education officer: [employer]   Tobacco Use  . Smoking status: Current Every Day Smoker    Packs/day: 0.50    Types: Cigarettes    Start date: 01/07/1982  . Smokeless tobacco: Never Used  . Tobacco comment: 1/2 pack daily  Substance and Sexual Activity  . Alcohol use: No  . Drug use: No  . Sexual activity: Not on file  Other Topics Concern  . Not on file  Social History Narrative  . Not on file   Social Determinants of Health   Financial Resource Strain:   . Difficulty of Paying Living Expenses: Not on file  Food Insecurity:   . Worried About Charity fundraiser in the Last Year: Not on file  . Ran Out of Food in the Last Year: Not on file  Transportation Needs:   . Lack of Transportation (Medical): Not on file  . Lack of Transportation (Non-Medical): Not on file  Physical Activity:   . Days of Exercise per Week: Not on file  . Minutes of Exercise per Session: Not on file  Stress:   . Feeling of Stress : Not on file  Social Connections:   . Frequency of Communication with Friends and Family: Not on file  . Frequency of Social Gatherings with Friends and Family: Not on file  . Attends Religious Services: Not on file  . Active Member of Clubs  or Organizations: Not on file  . Attends Archivist Meetings: Not on file  . Marital Status: Not on file    Subjective: Review of Systems  Constitutional: Negative for chills, fever, malaise/fatigue and weight loss.  HENT: Negative for congestion and sore throat.   Respiratory: Negative for cough and shortness of breath.   Cardiovascular: Negative for chest pain and palpitations.  Gastrointestinal: Negative for abdominal pain, blood in stool, constipation, diarrhea, melena, nausea and vomiting.  Musculoskeletal: Negative for joint pain and myalgias.  Skin: Negative for rash.  Neurological: Negative for dizziness and weakness.  Endo/Heme/Allergies: Does not bruise/bleed easily.  Psychiatric/Behavioral: Negative for depression. The patient is not nervous/anxious.   All other systems reviewed and are negative.    Objective: BP 120/83   Pulse 89   Temp (!) 96.9 F (36.1 C)   Ht 5\' 6"  (1.676 m)   Wt 175 lb 9.6 oz (79.7 kg)   BMI 28.34 kg/m  Physical Exam Vitals and nursing note reviewed.  Constitutional:      General: He is not in acute distress.    Appearance: Normal appearance. He is normal weight. He is not ill-appearing, toxic-appearing or diaphoretic.  HENT:     Head: Normocephalic and atraumatic.     Nose: No congestion or rhinorrhea.  Eyes:     General: No scleral icterus. Cardiovascular:     Rate and Rhythm: Normal rate and regular rhythm.     Heart sounds: Normal heart sounds.  Pulmonary:     Effort: Pulmonary effort is normal.     Breath sounds: Normal breath sounds.  Abdominal:     General: Bowel sounds are normal. There is no distension.     Palpations: Abdomen is soft. There is no hepatomegaly, splenomegaly or mass.     Tenderness: There is no abdominal tenderness. There is no guarding or rebound.     Hernia: No hernia is present.  Musculoskeletal:     Cervical back: Neck supple.  Skin:    General: Skin is warm and dry.     Coloration: Skin is  not jaundiced.     Findings: No bruising or rash.  Neurological:     General: No focal deficit present.     Mental Status: He is alert and oriented to person, place, and time. Mental status is at baseline.  Psychiatric:        Mood and Affect: Mood normal.        Behavior: Behavior normal.        Thought Content: Thought content normal.      Assessment:  Very pleasant 54 year old male presents for follow-up of constipation and abdominal pain.  Overall symptoms are doing much better.  No red flag/warning signs or symptoms.  Currently well managed on Colace and probiotic.  Symptoms have since resolved.  Recommend he continue his current medications and follow-up in 1 year.   Plan: 1. Continue current medications 2. Return for follow-up 1 year 3. Call for any worsening or severe symptoms before then    Thank you for allowing [place] to participate in the care of Daniel Skeans, DNP, AGNP-C Adult & Gerontological Nurse Practitioner [facility]   01/10/2020 11:10 AM   Disclaimer: This note was dictated with voice recognition software. Similar sounding words can inadvertently be transcribed and may not be corrected upon review.

## 2020-01-10 NOTE — Progress Notes (Signed)
Cc'ed to pcp °

## 2020-01-10 NOTE — Patient Instructions (Signed)
Your health issues we discussed today were:   Constipation with abdominal pain: 1. Glad your symptoms are doing better! 2. Continue taking Colace and probiotic 3. Let us know if you have any worsening or severe symptoms  Overall I recommend:  1. Continue taking your other medications 2. Return for follow-up in 1 year 3. Call us for any questions or concerns   ---------------------------------------------------------------  I am glad you have gotten your COVID-19 vaccination!  Even though you are fully vaccinated you should continue to follow CDC and state/local guidelines.  ---------------------------------------------------------------   At [facility] we value your feedback. You may receive a survey about your visit today. Please share your experience as we strive to create trusting relationships with our patients to provide genuine, compassionate, quality care.  We appreciate your understanding and patience as we review any laboratory studies, imaging, and other diagnostic tests that are ordered as we care for you. Our office policy is 5 business days for review of these results, and any emergent or urgent results are addressed in a timely manner for your best interest. If you do not hear from our office in 1 week, please contact us.   We also encourage the use of MyChart, which contains your medical information for your review as well. If you are not enrolled in this feature, an access code is on this after visit summary for your convenience. Thank you for allowing [place] to be involved in your care.  It was great to see you today!  I hope you have a Happy Thanksgiving!!

## 2020-06-04 DIAGNOSIS — E663 Overweight: Secondary | ICD-10-CM | POA: Diagnosis not present

## 2020-06-04 DIAGNOSIS — R7309 Other abnormal glucose: Secondary | ICD-10-CM | POA: Diagnosis not present

## 2020-06-04 DIAGNOSIS — R972 Elevated prostate specific antigen [PSA]: Secondary | ICD-10-CM | POA: Diagnosis not present

## 2020-06-04 DIAGNOSIS — Z6828 Body mass index (BMI) 28.0-28.9, adult: Secondary | ICD-10-CM | POA: Diagnosis not present

## 2020-06-04 DIAGNOSIS — N400 Enlarged prostate without lower urinary tract symptoms: Secondary | ICD-10-CM | POA: Diagnosis not present

## 2020-06-04 DIAGNOSIS — E7849 Other hyperlipidemia: Secondary | ICD-10-CM | POA: Diagnosis not present

## 2020-06-04 DIAGNOSIS — I10 Essential (primary) hypertension: Secondary | ICD-10-CM | POA: Diagnosis not present

## 2020-06-20 DIAGNOSIS — R972 Elevated prostate specific antigen [PSA]: Secondary | ICD-10-CM | POA: Diagnosis not present

## 2020-06-20 DIAGNOSIS — N400 Enlarged prostate without lower urinary tract symptoms: Secondary | ICD-10-CM | POA: Diagnosis not present

## 2020-07-02 DIAGNOSIS — Z6827 Body mass index (BMI) 27.0-27.9, adult: Secondary | ICD-10-CM | POA: Diagnosis not present

## 2020-07-02 DIAGNOSIS — I10 Essential (primary) hypertension: Secondary | ICD-10-CM | POA: Diagnosis not present

## 2020-07-02 DIAGNOSIS — M62830 Muscle spasm of back: Secondary | ICD-10-CM | POA: Diagnosis not present

## 2020-07-02 DIAGNOSIS — N400 Enlarged prostate without lower urinary tract symptoms: Secondary | ICD-10-CM | POA: Diagnosis not present

## 2020-08-05 DIAGNOSIS — R972 Elevated prostate specific antigen [PSA]: Secondary | ICD-10-CM | POA: Diagnosis not present

## 2020-08-05 DIAGNOSIS — N400 Enlarged prostate without lower urinary tract symptoms: Secondary | ICD-10-CM | POA: Diagnosis not present

## 2020-08-12 ENCOUNTER — Other Ambulatory Visit: Payer: Self-pay | Admitting: Urology

## 2020-08-12 DIAGNOSIS — R972 Elevated prostate specific antigen [PSA]: Secondary | ICD-10-CM

## 2020-09-07 ENCOUNTER — Other Ambulatory Visit: Payer: Self-pay

## 2020-09-07 ENCOUNTER — Ambulatory Visit
Admission: RE | Admit: 2020-09-07 | Discharge: 2020-09-07 | Disposition: A | Discharge status: Home / Self Care | Payer: BC Managed Care – PPO | Source: Ambulatory Visit | Attending: Urology | Admitting: Urology

## 2020-09-07 DIAGNOSIS — R972 Elevated prostate specific antigen [PSA]: Secondary | ICD-10-CM

## 2020-09-07 DIAGNOSIS — K5730 Diverticulosis of large intestine without perforation or abscess without bleeding: Secondary | ICD-10-CM | POA: Diagnosis not present

## 2020-09-07 DIAGNOSIS — R590 Localized enlarged lymph nodes: Secondary | ICD-10-CM | POA: Diagnosis not present

## 2020-09-07 DIAGNOSIS — N400 Enlarged prostate without lower urinary tract symptoms: Secondary | ICD-10-CM | POA: Diagnosis not present

## 2020-09-07 MED ORDER — GADOBENATE DIMEGLUMINE 529 MG/ML IV SOLN
16.0000 mL | Freq: Once | INTRAVENOUS | Status: AC | PRN
  Administered 2020-09-07: 16 mL via INTRAVENOUS

## 2020-09-22 DIAGNOSIS — Z87442 Personal history of urinary calculi: Secondary | ICD-10-CM | POA: Diagnosis not present

## 2020-09-22 DIAGNOSIS — R972 Elevated prostate specific antigen [PSA]: Secondary | ICD-10-CM | POA: Diagnosis not present

## 2020-09-22 DIAGNOSIS — R310 Gross hematuria: Secondary | ICD-10-CM | POA: Diagnosis not present

## 2020-09-25 DIAGNOSIS — K5730 Diverticulosis of large intestine without perforation or abscess without bleeding: Secondary | ICD-10-CM | POA: Diagnosis not present

## 2020-09-25 DIAGNOSIS — N281 Cyst of kidney, acquired: Secondary | ICD-10-CM | POA: Diagnosis not present

## 2020-09-25 DIAGNOSIS — R310 Gross hematuria: Secondary | ICD-10-CM | POA: Diagnosis not present

## 2020-09-25 DIAGNOSIS — N200 Calculus of kidney: Secondary | ICD-10-CM | POA: Diagnosis not present

## 2020-10-10 DIAGNOSIS — R972 Elevated prostate specific antigen [PSA]: Secondary | ICD-10-CM | POA: Diagnosis not present

## 2020-10-10 DIAGNOSIS — D291 Benign neoplasm of prostate: Secondary | ICD-10-CM | POA: Diagnosis not present

## 2021-01-06 ENCOUNTER — Encounter: Payer: Self-pay | Admitting: Internal Medicine

## 2021-05-19 DIAGNOSIS — M9901 Segmental and somatic dysfunction of cervical region: Secondary | ICD-10-CM | POA: Diagnosis not present

## 2021-05-19 DIAGNOSIS — M5116 Intervertebral disc disorders with radiculopathy, lumbar region: Secondary | ICD-10-CM | POA: Diagnosis not present

## 2021-05-19 DIAGNOSIS — M50322 Other cervical disc degeneration at C5-C6 level: Secondary | ICD-10-CM | POA: Diagnosis not present

## 2021-05-19 DIAGNOSIS — M9903 Segmental and somatic dysfunction of lumbar region: Secondary | ICD-10-CM | POA: Diagnosis not present

## 2021-05-20 DIAGNOSIS — M9903 Segmental and somatic dysfunction of lumbar region: Secondary | ICD-10-CM | POA: Diagnosis not present

## 2021-05-20 DIAGNOSIS — M50322 Other cervical disc degeneration at C5-C6 level: Secondary | ICD-10-CM | POA: Diagnosis not present

## 2021-05-20 DIAGNOSIS — M5116 Intervertebral disc disorders with radiculopathy, lumbar region: Secondary | ICD-10-CM | POA: Diagnosis not present

## 2021-05-20 DIAGNOSIS — M9901 Segmental and somatic dysfunction of cervical region: Secondary | ICD-10-CM | POA: Diagnosis not present

## 2021-05-21 DIAGNOSIS — M50322 Other cervical disc degeneration at C5-C6 level: Secondary | ICD-10-CM | POA: Diagnosis not present

## 2021-05-21 DIAGNOSIS — M5116 Intervertebral disc disorders with radiculopathy, lumbar region: Secondary | ICD-10-CM | POA: Diagnosis not present

## 2021-05-21 DIAGNOSIS — M9903 Segmental and somatic dysfunction of lumbar region: Secondary | ICD-10-CM | POA: Diagnosis not present

## 2021-05-21 DIAGNOSIS — M9901 Segmental and somatic dysfunction of cervical region: Secondary | ICD-10-CM | POA: Diagnosis not present

## 2021-05-25 DIAGNOSIS — M9903 Segmental and somatic dysfunction of lumbar region: Secondary | ICD-10-CM | POA: Diagnosis not present

## 2021-05-25 DIAGNOSIS — M5116 Intervertebral disc disorders with radiculopathy, lumbar region: Secondary | ICD-10-CM | POA: Diagnosis not present

## 2021-05-25 DIAGNOSIS — M9901 Segmental and somatic dysfunction of cervical region: Secondary | ICD-10-CM | POA: Diagnosis not present

## 2021-05-25 DIAGNOSIS — M47816 Spondylosis without myelopathy or radiculopathy, lumbar region: Secondary | ICD-10-CM | POA: Diagnosis not present

## 2021-05-25 DIAGNOSIS — M50322 Other cervical disc degeneration at C5-C6 level: Secondary | ICD-10-CM | POA: Diagnosis not present

## 2021-05-26 DIAGNOSIS — M9903 Segmental and somatic dysfunction of lumbar region: Secondary | ICD-10-CM | POA: Diagnosis not present

## 2021-05-26 DIAGNOSIS — M9901 Segmental and somatic dysfunction of cervical region: Secondary | ICD-10-CM | POA: Diagnosis not present

## 2021-05-26 DIAGNOSIS — M5116 Intervertebral disc disorders with radiculopathy, lumbar region: Secondary | ICD-10-CM | POA: Diagnosis not present

## 2021-05-26 DIAGNOSIS — M50322 Other cervical disc degeneration at C5-C6 level: Secondary | ICD-10-CM | POA: Diagnosis not present

## 2021-05-28 DIAGNOSIS — M5116 Intervertebral disc disorders with radiculopathy, lumbar region: Secondary | ICD-10-CM | POA: Diagnosis not present

## 2021-05-28 DIAGNOSIS — M9901 Segmental and somatic dysfunction of cervical region: Secondary | ICD-10-CM | POA: Diagnosis not present

## 2021-05-28 DIAGNOSIS — M9903 Segmental and somatic dysfunction of lumbar region: Secondary | ICD-10-CM | POA: Diagnosis not present

## 2021-05-28 DIAGNOSIS — M50322 Other cervical disc degeneration at C5-C6 level: Secondary | ICD-10-CM | POA: Diagnosis not present

## 2021-06-01 DIAGNOSIS — M50322 Other cervical disc degeneration at C5-C6 level: Secondary | ICD-10-CM | POA: Diagnosis not present

## 2021-06-01 DIAGNOSIS — M5116 Intervertebral disc disorders with radiculopathy, lumbar region: Secondary | ICD-10-CM | POA: Diagnosis not present

## 2021-06-01 DIAGNOSIS — M9901 Segmental and somatic dysfunction of cervical region: Secondary | ICD-10-CM | POA: Diagnosis not present

## 2021-06-01 DIAGNOSIS — M9903 Segmental and somatic dysfunction of lumbar region: Secondary | ICD-10-CM | POA: Diagnosis not present

## 2021-06-03 DIAGNOSIS — M5116 Intervertebral disc disorders with radiculopathy, lumbar region: Secondary | ICD-10-CM | POA: Diagnosis not present

## 2021-06-03 DIAGNOSIS — M9903 Segmental and somatic dysfunction of lumbar region: Secondary | ICD-10-CM | POA: Diagnosis not present

## 2021-06-03 DIAGNOSIS — M50322 Other cervical disc degeneration at C5-C6 level: Secondary | ICD-10-CM | POA: Diagnosis not present

## 2021-06-03 DIAGNOSIS — M9901 Segmental and somatic dysfunction of cervical region: Secondary | ICD-10-CM | POA: Diagnosis not present

## 2021-06-04 DIAGNOSIS — M50322 Other cervical disc degeneration at C5-C6 level: Secondary | ICD-10-CM | POA: Diagnosis not present

## 2021-06-04 DIAGNOSIS — M9903 Segmental and somatic dysfunction of lumbar region: Secondary | ICD-10-CM | POA: Diagnosis not present

## 2021-06-04 DIAGNOSIS — M5116 Intervertebral disc disorders with radiculopathy, lumbar region: Secondary | ICD-10-CM | POA: Diagnosis not present

## 2021-06-04 DIAGNOSIS — M9901 Segmental and somatic dysfunction of cervical region: Secondary | ICD-10-CM | POA: Diagnosis not present

## 2021-06-09 DIAGNOSIS — M9903 Segmental and somatic dysfunction of lumbar region: Secondary | ICD-10-CM | POA: Diagnosis not present

## 2021-06-09 DIAGNOSIS — M5116 Intervertebral disc disorders with radiculopathy, lumbar region: Secondary | ICD-10-CM | POA: Diagnosis not present

## 2021-06-09 DIAGNOSIS — M9901 Segmental and somatic dysfunction of cervical region: Secondary | ICD-10-CM | POA: Diagnosis not present

## 2021-06-09 DIAGNOSIS — M50322 Other cervical disc degeneration at C5-C6 level: Secondary | ICD-10-CM | POA: Diagnosis not present

## 2021-06-10 DIAGNOSIS — M5116 Intervertebral disc disorders with radiculopathy, lumbar region: Secondary | ICD-10-CM | POA: Diagnosis not present

## 2021-06-10 DIAGNOSIS — M9903 Segmental and somatic dysfunction of lumbar region: Secondary | ICD-10-CM | POA: Diagnosis not present

## 2021-06-10 DIAGNOSIS — M9901 Segmental and somatic dysfunction of cervical region: Secondary | ICD-10-CM | POA: Diagnosis not present

## 2021-06-10 DIAGNOSIS — M50322 Other cervical disc degeneration at C5-C6 level: Secondary | ICD-10-CM | POA: Diagnosis not present

## 2021-06-11 DIAGNOSIS — M50322 Other cervical disc degeneration at C5-C6 level: Secondary | ICD-10-CM | POA: Diagnosis not present

## 2021-06-11 DIAGNOSIS — M9903 Segmental and somatic dysfunction of lumbar region: Secondary | ICD-10-CM | POA: Diagnosis not present

## 2021-06-11 DIAGNOSIS — M9901 Segmental and somatic dysfunction of cervical region: Secondary | ICD-10-CM | POA: Diagnosis not present

## 2021-06-11 DIAGNOSIS — M5116 Intervertebral disc disorders with radiculopathy, lumbar region: Secondary | ICD-10-CM | POA: Diagnosis not present

## 2021-06-14 ENCOUNTER — Encounter (HOSPITAL_COMMUNITY): Payer: Self-pay | Admitting: *Deleted

## 2021-06-14 ENCOUNTER — Ambulatory Visit (HOSPITAL_COMMUNITY)
Admission: EM | Admit: 2021-06-14 | Discharge: 2021-06-14 | Disposition: A | Discharge status: Home / Self Care | Payer: BC Managed Care – PPO | Attending: Nurse Practitioner | Admitting: Nurse Practitioner

## 2021-06-14 ENCOUNTER — Other Ambulatory Visit: Payer: Self-pay

## 2021-06-14 DIAGNOSIS — M5432 Sciatica, left side: Secondary | ICD-10-CM

## 2021-06-14 MED ORDER — METHYLPREDNISOLONE SODIUM SUCC 125 MG IJ SOLR
INTRAMUSCULAR | Status: AC
  Filled 2021-06-14: qty 2

## 2021-06-14 MED ORDER — KETOROLAC TROMETHAMINE 30 MG/ML IJ SOLN
INTRAMUSCULAR | Status: AC
  Filled 2021-06-14: qty 1

## 2021-06-14 MED ORDER — CYCLOBENZAPRINE HCL 10 MG PO TABS: 10.0000 mg | ORAL_TABLET | Freq: Two times a day (BID) | ORAL | 0 refills | Status: DC | PRN

## 2021-06-14 MED ORDER — IBUPROFEN 800 MG PO TABS: 800.0000 mg | ORAL_TABLET | Freq: Three times a day (TID) | ORAL | 0 refills | Status: DC

## 2021-06-14 MED ORDER — KETOROLAC TROMETHAMINE 30 MG/ML IJ SOLN
30.0000 mg | Freq: Once | INTRAMUSCULAR | Status: AC
  Administered 2021-06-14: 30 mg via INTRAMUSCULAR

## 2021-06-14 MED ORDER — METHYLPREDNISOLONE SODIUM SUCC 125 MG IJ SOLR
80.0000 mg | Freq: Once | INTRAMUSCULAR | Status: AC
  Administered 2021-06-14: 80 mg via INTRAMUSCULAR

## 2021-06-14 NOTE — ED Provider Notes (Signed)
[facility]    CSN: [id number] Arrival date & time: 06/14/21  1539      History   Chief Complaint Chief Complaint  Patient presents with   Back Pain    HPI Daniel Mcpherson is a 56 y.o. male.   The patient is a 56 year old male who presents with low back pain.  Pain is located on the left lumbar spine.  He states the pain radiates down into the left lower extremity.  Symptoms started approximately 1 week ago.  States he was reaching for something and felt a "pop" in his back.  Patient also has a history of herniated disc and previous back pain that dates back about 20+ years ago.  The patient has seen chiro and a chiropractor and a decompression clinic for his symptoms.  Today, he has stiffness and pain in the left lumbar spine.   He states over the past 24 to 48 hours, he has noticed less improvement with this strategy.  He states that he has been working.  He states that once he gets up and gets moving, he is able to get through his day without difficulty.  He states when he has to sit for prolonged periods of time, his back becomes stiff.  He denies fever, chills, urinary symptoms, numbness, tingling, weakness, or loss of bowel or bladder function.  Patient has been using ice and stretching to help with his symptoms.  Patient states in the past he has been giving NSAIDs and a muscle relaxer, which seem to help his symptoms.  He cannot recall the name of the medications he was prescribed in the past.  The history is provided by the patient.   Past Medical History:  Diagnosis Date   Anxiety    Colon polyps    Diverticulosis    Hyperlipidemia    Hypertension    Pancreatitis    hypertriglyceridema, single episode did not require hospitalization, [facility]. Around 2000.   Tubular adenoma     Patient Active Problem List   Diagnosis Date Noted   Constipation 05/03/2019   Abdominal pain 05/03/2019   History of colonic polyps 06/18/2013   Rectal bleeding 06/18/2013   Fall  11/26/2012   Multiple fractures of ribs of left side 11/26/2012   Left scapula fracture 11/26/2012   Fractures of thoracic transverse processes 11/26/2012   HTN (hypertension) 11/26/2012   Depression 11/26/2012   Concussion with loss of consciousness 11/26/2012    Past Surgical History:  Procedure Laterality Date   COLONOSCOPY N/A 07/04/2013   Dr. Jena Gauss- multiple rectal and colonic polyos removed. colonic dierticulosis, tubular adenoma on bx.   COLONOSCOPY N/A 10/04/2013   Procedure: COLONOSCOPY;  Surgeon: Corbin Ade, MD;  Location: [facility];  Service: Endoscopy;  Laterality: N/A;  10:30 AM   COLONOSCOPY N/A 07/08/2016   Procedure: COLONOSCOPY;  Surgeon: Corbin Ade, MD;  Location: [facility];  Service: Endoscopy;  Laterality: N/A;  10:30am   FEMORAL HERNIA REPAIR     KNEE SURGERY     left   POLYPECTOMY  07/08/2016   Procedure: POLYPECTOMY;  Surgeon: Corbin Ade, MD;  Location: [facility];  Service: Endoscopy;;  colon   SHOULDER SURGERY  11/2012   left       Home Medications    Prior to Admission medications   Medication Sig Start Date End Date Taking? Authorizing Provider  cyclobenzaprine (FLEXERIL) 10 MG tablet Take 1 tablet (10 mg total) by mouth 2 (two)  times daily as needed for muscle spasms. 06/14/21  Yes Leath-Warren, Sadie Haber, NP  ibuprofen (ADVIL) 800 MG tablet Take 1 tablet (800 mg total) by mouth 3 (three) times daily. Take medication with food and water. 06/14/21  Yes Leath-Warren, Sadie Haber, NP  aspirin EC 81 MG tablet Take 81 mg by mouth daily.    [provider]  cholecalciferol (VITAMIN D) 1000 UNITS tablet Take 1,000 Units by mouth daily.    [provider]  docusate sodium (COLACE) 100 MG capsule Take 100 mg by mouth daily.    [provider]  escitalopram (LEXAPRO) 10 MG tablet Take 10 mg by mouth daily. 11/19/12   [provider]  fenofibrate 160 MG tablet Take 160 mg by mouth daily.  05/15/13   [provider]  GLUCOSAMINE-CHONDROITIN PO Take 1 tablet by mouth daily.    [provider]  lisinopril-hydrochlorothiazide (PRINZIDE,ZESTORETIC) 10-12.5 MG per tablet Take 1 tablet by mouth daily.  05/14/13   [provider]  Omega-3 Fatty Acids (FISH OIL) 1200 MG CAPS Take 1,200 mg by mouth 2 (two) times daily.    [provider]  Probiotic Product (PROBIOTIC PO) Take 1 capsule by mouth daily.    [provider]  simvastatin (ZOCOR) 80 MG tablet Take 80 mg by mouth daily at 6 PM.  05/15/13   [provider]    Family History Family History  Problem Relation Age of Onset   Hypercholesterolemia Mother    Colon cancer Neg Hx    Pancreatitis Neg Hx     Social History Social History   Tobacco Use   Smoking status: Every Day    Packs/day: 0.50    Types: Cigarettes    Start date: 01/07/1982   Smokeless tobacco: Never   Tobacco comments:    1/2 pack daily  Substance Use Topics   Alcohol use: No   Drug use: No     Allergies   Patient has no known allergies.   Review of Systems Review of Systems  Constitutional: Negative.   Gastrointestinal: Negative.   Musculoskeletal:  Positive for back pain.  Skin: Negative.   Psychiatric/Behavioral: Negative.      Physical Exam Triage Vital Signs ED Triage Vitals  Enc Vitals Group     BP 06/14/21 1722 (!) 146/87     Pulse Rate 06/14/21 1722 97     Resp 06/14/21 1722 18     Temp 06/14/21 1722 98.6 F (37 C)     Temp src --      SpO2 06/14/21 1722 97 %     Weight --      Height --      Head Circumference --      Peak Flow --      Pain Score 06/14/21 1720 7     Pain Loc --      Pain Edu? --      Excl. in GC? --    No data found.  Updated Vital Signs BP (!) 146/87   Pulse 97   Temp 98.6 F (37 C)   Resp 18   SpO2 97%   Visual Acuity Right Eye Distance:   Left Eye Distance:   Bilateral Distance:    Right Eye Near:   Left Eye Near:    Bilateral Near:     Physical  Exam Vitals reviewed.  Constitutional:      General: He is not in acute distress.    Appearance: Normal appearance.  Cardiovascular:  Rate and Rhythm: Normal rate and regular rhythm.  Pulmonary:     Effort: Pulmonary effort is normal.     Breath sounds: Normal breath sounds.  Abdominal:     General: Bowel sounds are normal.     Palpations: Abdomen is soft.  Musculoskeletal:     Thoracic back: Normal.     Lumbar back: No spasms, tenderness or bony tenderness. Decreased range of motion. Negative right straight leg raise test and negative left straight leg raise test.  Neurological:     Mental Status: He is alert.     UC Treatments / Results  Labs (all labs ordered are listed, but only abnormal results are displayed) Labs Reviewed - No data to display  EKG   Radiology No results found.  Procedures Procedures (including critical care time)  Medications Ordered in UC Medications  ketorolac (TORADOL) 30 MG/ML injection 30 mg (30 mg Intramuscular Given 06/14/21 1758)  methylPREDNISolone sodium succinate (SOLU-MEDROL) 125 mg/2 mL injection 80 mg (80 mg Intramuscular Given 06/14/21 1759)    Initial Impression / Assessment and Plan / UC Course  I have reviewed the triage vital signs and the nursing notes.  Pertinent labs & imaging results that were available during my care of the patient were reviewed by me and considered in my medical decision making (see chart for details).  The patient is a 56 year old male who presents for left-sided back pain.  Pain radiates into the left lower extremity.  He does have a significant history of back problems.  On exam, he does not have tenderness in the left lower spine, but he does have pain with movement.  Denies urinary symptoms.  Based on his history and exam, symptoms are consistent with sciatica.  Patient was advised to continue stretching and application of ice.  Solu-Medrol and Toradol were given in the clinic.  We will continue the  patient on ibuprofen and cyclobenzaprine as he states muscle relaxers and NSAIDs that helped in the past.  Patient advised to follow-up in the ER if he develops loss of bowel or bladder function, weakness, numbness, tingling, or other concerns. Final Clinical Impressions(s) / UC Diagnoses   Final diagnoses:  Sciatica of left side     Discharge Instructions      Take medication as prescribed.  Take the ibuprofen with food and water.  The muscle relaxer will make you drowsy.  No driving, operating any heavy equipment, or drinking alcohol when taking this medication. Continue ice and stretches to help with your symptoms. Avoid heavy lifting or strenuous activity while symptoms persist. Follow-up in the ER if you develop loss of bowel or bladder function, numbness, weakness or tingling in your legs. Follow-up as needed.      ED Prescriptions     Medication Sig Dispense Auth. Provider   ibuprofen (ADVIL) 800 MG tablet Take 1 tablet (800 mg total) by mouth 3 (three) times daily. Take medication with food and water. 30 tablet Leath-Warren, Sadie Haber, NP   cyclobenzaprine (FLEXERIL) 10 MG tablet Take 1 tablet (10 mg total) by mouth 2 (two) times daily as needed for muscle spasms. 20 tablet Leath-Warren, Sadie Haber, NP      PDMP not reviewed this encounter.   Abran Cantor, NP 06/14/21 [phone]

## 2021-06-14 NOTE — ED Triage Notes (Signed)
Pt reports ongoing back pain that radiates down Lt leg. ?

## 2021-06-14 NOTE — Discharge Instructions (Addendum)
Take medication as prescribed.  Take the ibuprofen with food and water.  The muscle relaxer will make you drowsy.  No driving, operating any heavy equipment, or drinking alcohol when taking this medication. ?Continue ice and stretches to help with your symptoms. ?Avoid heavy lifting or strenuous activity while symptoms persist. ?Follow-up in the ER if you develop loss of bowel or bladder function, numbness, weakness or tingling in your legs. ?Follow-up as needed. ? ?

## 2021-06-15 DIAGNOSIS — M5116 Intervertebral disc disorders with radiculopathy, lumbar region: Secondary | ICD-10-CM | POA: Diagnosis not present

## 2021-06-15 DIAGNOSIS — M9901 Segmental and somatic dysfunction of cervical region: Secondary | ICD-10-CM | POA: Diagnosis not present

## 2021-06-15 DIAGNOSIS — M9903 Segmental and somatic dysfunction of lumbar region: Secondary | ICD-10-CM | POA: Diagnosis not present

## 2021-06-15 DIAGNOSIS — M50322 Other cervical disc degeneration at C5-C6 level: Secondary | ICD-10-CM | POA: Diagnosis not present

## 2021-06-16 ENCOUNTER — Encounter: Payer: Self-pay | Admitting: *Deleted

## 2021-06-16 DIAGNOSIS — M5116 Intervertebral disc disorders with radiculopathy, lumbar region: Secondary | ICD-10-CM | POA: Diagnosis not present

## 2021-06-16 DIAGNOSIS — M9901 Segmental and somatic dysfunction of cervical region: Secondary | ICD-10-CM | POA: Diagnosis not present

## 2021-06-16 DIAGNOSIS — M50322 Other cervical disc degeneration at C5-C6 level: Secondary | ICD-10-CM | POA: Diagnosis not present

## 2021-06-16 DIAGNOSIS — M9903 Segmental and somatic dysfunction of lumbar region: Secondary | ICD-10-CM | POA: Diagnosis not present

## 2021-06-17 DIAGNOSIS — E663 Overweight: Secondary | ICD-10-CM | POA: Diagnosis not present

## 2021-06-17 DIAGNOSIS — M5116 Intervertebral disc disorders with radiculopathy, lumbar region: Secondary | ICD-10-CM | POA: Diagnosis not present

## 2021-06-17 DIAGNOSIS — Z6827 Body mass index (BMI) 27.0-27.9, adult: Secondary | ICD-10-CM | POA: Diagnosis not present

## 2021-06-18 DIAGNOSIS — M5116 Intervertebral disc disorders with radiculopathy, lumbar region: Secondary | ICD-10-CM | POA: Diagnosis not present

## 2021-06-18 DIAGNOSIS — M50322 Other cervical disc degeneration at C5-C6 level: Secondary | ICD-10-CM | POA: Diagnosis not present

## 2021-06-18 DIAGNOSIS — M9903 Segmental and somatic dysfunction of lumbar region: Secondary | ICD-10-CM | POA: Diagnosis not present

## 2021-06-18 DIAGNOSIS — M9901 Segmental and somatic dysfunction of cervical region: Secondary | ICD-10-CM | POA: Diagnosis not present

## 2021-06-22 DIAGNOSIS — M50322 Other cervical disc degeneration at C5-C6 level: Secondary | ICD-10-CM | POA: Diagnosis not present

## 2021-06-22 DIAGNOSIS — M9901 Segmental and somatic dysfunction of cervical region: Secondary | ICD-10-CM | POA: Diagnosis not present

## 2021-06-22 DIAGNOSIS — M9903 Segmental and somatic dysfunction of lumbar region: Secondary | ICD-10-CM | POA: Diagnosis not present

## 2021-06-22 DIAGNOSIS — M5116 Intervertebral disc disorders with radiculopathy, lumbar region: Secondary | ICD-10-CM | POA: Diagnosis not present

## 2021-06-24 DIAGNOSIS — M9903 Segmental and somatic dysfunction of lumbar region: Secondary | ICD-10-CM | POA: Diagnosis not present

## 2021-06-24 DIAGNOSIS — M50322 Other cervical disc degeneration at C5-C6 level: Secondary | ICD-10-CM | POA: Diagnosis not present

## 2021-06-24 DIAGNOSIS — M5116 Intervertebral disc disorders with radiculopathy, lumbar region: Secondary | ICD-10-CM | POA: Diagnosis not present

## 2021-06-24 DIAGNOSIS — M9901 Segmental and somatic dysfunction of cervical region: Secondary | ICD-10-CM | POA: Diagnosis not present

## 2021-06-29 DIAGNOSIS — M9901 Segmental and somatic dysfunction of cervical region: Secondary | ICD-10-CM | POA: Diagnosis not present

## 2021-06-29 DIAGNOSIS — M50322 Other cervical disc degeneration at C5-C6 level: Secondary | ICD-10-CM | POA: Diagnosis not present

## 2021-06-29 DIAGNOSIS — M5116 Intervertebral disc disorders with radiculopathy, lumbar region: Secondary | ICD-10-CM | POA: Diagnosis not present

## 2021-06-29 DIAGNOSIS — M9903 Segmental and somatic dysfunction of lumbar region: Secondary | ICD-10-CM | POA: Diagnosis not present

## 2021-07-06 DIAGNOSIS — M9903 Segmental and somatic dysfunction of lumbar region: Secondary | ICD-10-CM | POA: Diagnosis not present

## 2021-07-06 DIAGNOSIS — M5116 Intervertebral disc disorders with radiculopathy, lumbar region: Secondary | ICD-10-CM | POA: Diagnosis not present

## 2021-07-06 DIAGNOSIS — M9901 Segmental and somatic dysfunction of cervical region: Secondary | ICD-10-CM | POA: Diagnosis not present

## 2021-07-06 DIAGNOSIS — M50322 Other cervical disc degeneration at C5-C6 level: Secondary | ICD-10-CM | POA: Diagnosis not present

## 2021-08-11 ENCOUNTER — Telehealth: Payer: Self-pay | Admitting: *Deleted

## 2021-08-11 DIAGNOSIS — Z86010 Personal history of colonic polyps: Secondary | ICD-10-CM

## 2021-08-11 NOTE — Telephone Encounter (Signed)
Referring MD/PCP: Dr. Gerarda Fraction  Procedure: Colonoscopy  Has patient had this procedure before?  Dr. Gala Romney, 07/08/16  If so, when, by whom and where?    Is there a family history of colon cancer?  no  Who?  What age when diagnosed?    Is patient diabetic? If yes, Type 1 or Type 2   no      Does patient have prosthetic heart valve or mechanical valve?  no  Do you have a pacemaker/defibrillator?  no  Has patient ever had endocarditis/atrial fibrillation? no  Does patient use oxygen? no  Has patient had joint replacement within last 12 months?  no  Is patient constipated or do they take laxatives? no  Does patient have a history of alcohol/drug use?  no  Have you had a stroke/heart attack last 6 mths? no  Do you take medicine for weight loss?  no  Is patient on blood thinner such as Coumadin, Plavix and/or Aspirin? Yes, aspirin '81mg'$   Medications:  Current Outpatient Medications on File Prior to Visit  Medication Sig Dispense Refill   aspirin EC 81 MG tablet Take 81 mg by mouth daily.     cholecalciferol (VITAMIN D) 1000 UNITS tablet Take 1,000 Units by mouth daily.     cyclobenzaprine (FLEXERIL) 10 MG tablet Take 1 tablet (10 mg total) by mouth 2 (two) times daily as needed for muscle spasms. 20 tablet 0   docusate sodium (COLACE) 100 MG capsule Take 100 mg by mouth daily.     escitalopram (LEXAPRO) 10 MG tablet Take 10 mg by mouth daily.     fenofibrate 160 MG tablet Take 160 mg by mouth daily.      GLUCOSAMINE-CHONDROITIN PO Take 1 tablet by mouth daily.     ibuprofen (ADVIL) 800 MG tablet Take 1 tablet (800 mg total) by mouth 3 (three) times daily. Take medication with food and water. 30 tablet 0   lisinopril-hydrochlorothiazide (PRINZIDE,ZESTORETIC) 10-12.5 MG per tablet Take 1 tablet by mouth daily.      Omega-3 Fatty Acids (FISH OIL) 1200 MG CAPS Take 1,200 mg by mouth 2 (two) times daily.     Probiotic Product (PROBIOTIC PO) Take 1 capsule by mouth daily.     simvastatin  (ZOCOR) 80 MG tablet Take 80 mg by mouth daily at 6 PM.      No current facility-administered medications on file prior to visit.     Allergies: No Known Allergies

## 2021-09-02 NOTE — Telephone Encounter (Signed)
Colonoscopy 2018 with one 5 mm tubular adenoma. Multiple adenomas in 2015 with early interval colonoscopy needed and one tubular adenoma removed.   Appropriate. ASA 2. Needs BMP prior as on HCTZ.

## 2021-09-04 NOTE — Telephone Encounter (Signed)
LMOVM to call back 

## 2021-09-07 ENCOUNTER — Encounter: Payer: Self-pay | Admitting: *Deleted

## 2021-09-07 MED ORDER — PEG 3350-KCL-NA BICARB-NACL 420 G PO SOLR: ORAL | 0 refills | Status: AC

## 2021-09-07 NOTE — Telephone Encounter (Signed)
Pt returned call. Scheduled for 8/17 at 9:45am. Aware will mail instructions. Rx sent to pharmacy

## 2021-09-07 NOTE — Addendum Note (Signed)
Addended by: Cheron Every on: 09/07/2021 03:26 PM   Modules accepted: Orders

## 2021-10-05 DIAGNOSIS — Z0000 Encounter for general adult medical examination without abnormal findings: Secondary | ICD-10-CM | POA: Diagnosis not present

## 2021-10-05 DIAGNOSIS — Z6827 Body mass index (BMI) 27.0-27.9, adult: Secondary | ICD-10-CM | POA: Diagnosis not present

## 2021-10-05 DIAGNOSIS — N400 Enlarged prostate without lower urinary tract symptoms: Secondary | ICD-10-CM | POA: Diagnosis not present

## 2021-10-05 DIAGNOSIS — K635 Polyp of colon: Secondary | ICD-10-CM | POA: Diagnosis not present

## 2021-10-05 DIAGNOSIS — E119 Type 2 diabetes mellitus without complications: Secondary | ICD-10-CM | POA: Diagnosis not present

## 2021-10-05 DIAGNOSIS — E663 Overweight: Secondary | ICD-10-CM | POA: Diagnosis not present

## 2021-10-05 DIAGNOSIS — I10 Essential (primary) hypertension: Secondary | ICD-10-CM | POA: Diagnosis not present

## 2021-10-05 DIAGNOSIS — Z1331 Encounter for screening for depression: Secondary | ICD-10-CM | POA: Diagnosis not present

## 2021-10-05 DIAGNOSIS — E782 Mixed hyperlipidemia: Secondary | ICD-10-CM | POA: Diagnosis not present

## 2021-10-05 DIAGNOSIS — R972 Elevated prostate specific antigen [PSA]: Secondary | ICD-10-CM | POA: Diagnosis not present

## 2021-10-05 DIAGNOSIS — E7849 Other hyperlipidemia: Secondary | ICD-10-CM | POA: Diagnosis not present

## 2021-10-12 ENCOUNTER — Other Ambulatory Visit (HOSPITAL_COMMUNITY)
Admission: RE | Admit: 2021-10-12 | Discharge: 2021-10-12 | Disposition: A | Discharge status: Home / Self Care | Payer: BC Managed Care – PPO | Source: Ambulatory Visit | Attending: Internal Medicine | Admitting: Internal Medicine

## 2021-10-12 DIAGNOSIS — F32A Depression, unspecified: Secondary | ICD-10-CM | POA: Diagnosis not present

## 2021-10-12 DIAGNOSIS — Z86010 Personal history of colonic polyps: Secondary | ICD-10-CM | POA: Insufficient documentation

## 2021-10-12 DIAGNOSIS — Z79899 Other long term (current) drug therapy: Secondary | ICD-10-CM | POA: Diagnosis not present

## 2021-10-12 DIAGNOSIS — K5730 Diverticulosis of large intestine without perforation or abscess without bleeding: Secondary | ICD-10-CM | POA: Diagnosis not present

## 2021-10-12 DIAGNOSIS — Z1211 Encounter for screening for malignant neoplasm of colon: Secondary | ICD-10-CM | POA: Diagnosis not present

## 2021-10-12 DIAGNOSIS — I10 Essential (primary) hypertension: Secondary | ICD-10-CM | POA: Diagnosis not present

## 2021-10-12 DIAGNOSIS — D123 Benign neoplasm of transverse colon: Secondary | ICD-10-CM | POA: Diagnosis not present

## 2021-10-12 DIAGNOSIS — K5140 Inflammatory polyps of colon without complications: Secondary | ICD-10-CM | POA: Diagnosis not present

## 2021-10-12 DIAGNOSIS — F17210 Nicotine dependence, cigarettes, uncomplicated: Secondary | ICD-10-CM | POA: Diagnosis not present

## 2021-10-12 DIAGNOSIS — F419 Anxiety disorder, unspecified: Secondary | ICD-10-CM | POA: Diagnosis not present

## 2021-10-12 LAB — BASIC METABOLIC PANEL
Anion gap: 7 (ref 5–15)
BUN: 21 mg/dL — ABNORMAL HIGH (ref 6–20)
CO2: 26 mmol/L (ref 22–32)
Calcium: 8.8 mg/dL — ABNORMAL LOW (ref 8.9–10.3)
Chloride: 107 mmol/L (ref 98–111)
Creatinine, Ser: 0.82 mg/dL (ref 0.61–1.24)
GFR, Estimated: >60 mL/min (ref >60)
Glucose, Bld: 113 mg/dL — ABNORMAL HIGH (ref 70–99)
Potassium: 4 mmol/L (ref 3.5–5.1)
Sodium: 140 mmol/L (ref 135–145)

## 2021-10-15 ENCOUNTER — Ambulatory Visit (HOSPITAL_COMMUNITY)
Admission: RE | Admit: 2021-10-15 | Discharge: 2021-10-15 | Disposition: A | Discharge status: Home / Self Care | Payer: BC Managed Care – PPO | Attending: Internal Medicine | Admitting: Internal Medicine

## 2021-10-15 ENCOUNTER — Other Ambulatory Visit: Payer: Self-pay

## 2021-10-15 ENCOUNTER — Ambulatory Visit (HOSPITAL_COMMUNITY): Payer: BC Managed Care – PPO | Admitting: Anesthesiology

## 2021-10-15 ENCOUNTER — Encounter (HOSPITAL_COMMUNITY)
Admission: RE | Disposition: A | Discharge status: Home / Self Care | Payer: Self-pay | Source: Home / Self Care | Attending: Internal Medicine

## 2021-10-15 ENCOUNTER — Encounter (HOSPITAL_COMMUNITY): Payer: Self-pay | Admitting: Internal Medicine

## 2021-10-15 DIAGNOSIS — F17210 Nicotine dependence, cigarettes, uncomplicated: Secondary | ICD-10-CM | POA: Insufficient documentation

## 2021-10-15 DIAGNOSIS — K635 Polyp of colon: Secondary | ICD-10-CM | POA: Diagnosis not present

## 2021-10-15 DIAGNOSIS — F32A Depression, unspecified: Secondary | ICD-10-CM | POA: Diagnosis not present

## 2021-10-15 DIAGNOSIS — Z79899 Other long term (current) drug therapy: Secondary | ICD-10-CM | POA: Diagnosis not present

## 2021-10-15 DIAGNOSIS — D123 Benign neoplasm of transverse colon: Secondary | ICD-10-CM | POA: Insufficient documentation

## 2021-10-15 DIAGNOSIS — Z09 Encounter for follow-up examination after completed treatment for conditions other than malignant neoplasm: Secondary | ICD-10-CM

## 2021-10-15 DIAGNOSIS — Z86010 Personal history of colonic polyps: Secondary | ICD-10-CM | POA: Insufficient documentation

## 2021-10-15 DIAGNOSIS — K5730 Diverticulosis of large intestine without perforation or abscess without bleeding: Secondary | ICD-10-CM | POA: Diagnosis not present

## 2021-10-15 DIAGNOSIS — I10 Essential (primary) hypertension: Secondary | ICD-10-CM | POA: Diagnosis not present

## 2021-10-15 DIAGNOSIS — K5140 Inflammatory polyps of colon without complications: Secondary | ICD-10-CM | POA: Diagnosis not present

## 2021-10-15 DIAGNOSIS — F419 Anxiety disorder, unspecified: Secondary | ICD-10-CM | POA: Insufficient documentation

## 2021-10-15 DIAGNOSIS — Z1211 Encounter for screening for malignant neoplasm of colon: Secondary | ICD-10-CM | POA: Diagnosis not present

## 2021-10-15 HISTORY — PX: COLONOSCOPY WITH PROPOFOL: SHX5780

## 2021-10-15 HISTORY — PX: POLYPECTOMY: SHX5525

## 2021-10-15 SURGERY — COLONOSCOPY WITH PROPOFOL
Anesthesia: General

## 2021-10-15 MED ORDER — PROPOFOL 10 MG/ML IV BOLUS
INTRAVENOUS | Status: DC | PRN
  Administered 2021-10-15 (×3): 20 mg via INTRAVENOUS
  Administered 2021-10-15: 150 mg via INTRAVENOUS

## 2021-10-15 MED ORDER — LACTATED RINGERS IV SOLN: INTRAVENOUS | Status: DC

## 2021-10-15 NOTE — Anesthesia Procedure Notes (Signed)
Date/Time: 10/15/2021 9:21 AM  Performed by: Vista Deck, CRNAPre-anesthesia Checklist: Patient identified, Emergency Drugs available, Suction available, Timeout performed and Patient being monitored Patient Re-evaluated:Patient Re-evaluated prior to induction Oxygen Delivery Method: Nasal Cannula

## 2021-10-15 NOTE — Progress Notes (Signed)
Approximately 2-3cm crusted lesion noted to patient left posterior upper back as patient putting on shirt postoperatively. Recommended patient have lesion checked by a doctor.Patient agrees.

## 2021-10-15 NOTE — Discharge Instructions (Addendum)
  Colonoscopy Discharge Instructions  Read the instructions outlined below and refer to this sheet in the next few weeks. These discharge instructions provide you with general information on caring for yourself after you leave the hospital. Your doctor may also give you specific instructions. While your treatment has been planned according to the most current medical practices available, unavoidable complications occasionally occur. If you have any problems or questions after discharge, call Dr. Gala Romney at [phone]. ACTIVITY You may resume your regular activity, but move at a slower pace for the next 24 hours.  Take frequent rest periods for the next 24 hours.  Walking will help get rid of the air and reduce the bloated feeling in your belly (abdomen).  No driving for 24 hours (because of the medicine (anesthesia) used during the test).   Do not sign any important legal documents or operate any machinery for 24 hours (because of the anesthesia used during the test).  NUTRITION Drink plenty of fluids.  You may resume your normal diet as instructed by your doctor.  Begin with a light meal and progress to your normal diet. Heavy or fried foods are harder to digest and may make you feel sick to your stomach (nauseated).  Avoid alcoholic beverages for 24 hours or as instructed.  MEDICATIONS You may resume your normal medications unless your doctor tells you otherwise.  WHAT YOU CAN EXPECT TODAY Some feelings of bloating in the abdomen.  Passage of more gas than usual.  Spotting of blood in your stool or on the toilet paper.  IF YOU HAD POLYPS REMOVED DURING THE COLONOSCOPY: No aspirin products for 7 days or as instructed.  No alcohol for 7 days or as instructed.  Eat a soft diet for the next 24 hours.  FINDING OUT THE RESULTS OF YOUR TEST Not all test results are available during your visit. If your test results are not back during the visit, make an appointment with your caregiver to find out the  results. Do not assume everything is normal if you have not heard from your caregiver or the medical facility. It is important for you to follow up on all of your test results.  SEEK IMMEDIATE MEDICAL ATTENTION IF: You have more than a spotting of blood in your stool.  Your belly is swollen (abdominal distention).  You are nauseated or vomiting.  You have a temperature over 101.  You have abdominal pain or discomfort that is severe or gets worse throughout the day.     3 polyps removed from your colon today  Colon polyp and diverticulosis information provided  Further recommendations to follow pending review of pathology report  At patient request, I Called [name]., Sr at [phone] findings and recommendations

## 2021-10-15 NOTE — Op Note (Signed)
[facility] Patient Name: Daniel Mcpherson Procedure Date: 10/15/2021 9:08 AM MRN: [id number] Date of Birth: March 28, 1965 Attending MD: Norvel Richards , MD CSN: [id number] Age: 56 Admit Type: Outpatient Procedure:                Colonoscopy Indications:              High risk colon cancer surveillance: Personal                            history of colonic polyps Providers:                Norvel Richards, MD, Caprice Kluver, Raphael Gibney, Technician Referring MD:              Medicines:                Propofol per Anesthesia Complications:            No immediate complications. Estimated Blood Loss:     Estimated blood loss was minimal. Procedure:                Pre-Anesthesia Assessment:                           - Prior to the procedure, a History and Physical                            was performed, and patient medications and                            allergies were reviewed. The patient's tolerance of                            previous anesthesia was also reviewed. The risks                            and benefits of the procedure and the sedation                            options and risks were discussed with the patient.                            All questions were answered, and informed consent                            was obtained. Prior Anticoagulants: The patient has                            taken no previous anticoagulant or antiplatelet                            agents. ASA Grade Assessment: II - A patient with  mild systemic disease. After reviewing the risks                            and benefits, the patient was deemed in                            satisfactory condition to undergo the procedure.                           After obtaining informed consent, the colonoscope                            was passed under direct vision. Throughout the                            procedure, the patient's blood  pressure, pulse, and                            oxygen saturations were monitored continuously. The                            [id number] scope was introduced through the                            anus and advanced to the the cecum, identified by                            appendiceal orifice and ileocecal valve. The                            colonoscopy was performed without difficulty. The                            patient tolerated the procedure well. The quality                            of the bowel preparation was adequate. The                            ileocecal valve, appendiceal orifice, and rectum                            were photographed. The entire colon was well                            visualized. Scope In: 9:27:06 AM Scope Out: 9:40:13 AM Scope Withdrawal Time: 0 hours 11 minutes 14 seconds  Total Procedure Duration: 0 hours 13 minutes 7 seconds  Findings:      Three semi-pedunculated polyps were found in the splenic flexure and       ileocecal valve. The polyps were 4 to 7 mm in size. These polyps were       removed with a cold snare. Resection and retrieval were complete.       Estimated blood loss was minimal.  Scattered small and large-mouthed diverticula were found in the entire       colon.      The exam was otherwise without abnormality on direct and retroflexion       views. Impression:               - Three 4 to 7 mm polyps at the splenic flexure and                            at the ileocecal valve, removed with a cold snare.                            Resected and retrieved.                           - Diverticulosis in the entire examined colon.                           - The examination was otherwise normal on direct                            and retroflexion views. Moderate Sedation:      Moderate (conscious) sedation was personally administered by an       anesthesia professional. The following parameters were monitored: oxygen        saturation, heart rate, blood pressure, respiratory rate, EKG, adequacy       of pulmonary ventilation, and response to care. Recommendation:           - Patient has a contact number available for                            emergencies. The signs and symptoms of potential                            delayed complications were discussed with the                            patient. Return to normal activities tomorrow.                            Written discharge instructions were provided to the                            patient.                           - Resume previous diet.                           - Repeat colonoscopy date to be determined after                            pending pathology results are reviewed for                            surveillance.                           -  Return to GI office (date not yet determined). Procedure Code(s):        --- Professional ---                           [phone], Colonoscopy, flexible; with removal of                            tumor(s), polyp(s), or other lesion(s) by snare                            technique Diagnosis Code(s):        --- Professional ---                           Z86.010, Personal history of colonic polyps                           K63.5, Polyp of colon                           K57.30, Diverticulosis of large intestine without                            perforation or abscess without bleeding CPT copyright 2019 [employer]. All rights reserved. The codes documented in this report are preliminary and upon coder review may  be revised to meet current compliance requirements. [name]. [name], MD Norvel Richards, MD 10/15/2021 9:47:44 AM This report has been signed electronically. Number of Addenda: 0

## 2021-10-15 NOTE — Transfer of Care (Signed)
Immediate Anesthesia Transfer of Care Note  Patient: Daniel Mcpherson  Procedure(s) Performed: COLONOSCOPY WITH PROPOFOL POLYPECTOMY  Patient Location: Endoscopy Unit  Anesthesia Type:General  Level of Consciousness: awake and patient cooperative  Airway & Oxygen Therapy: Patient Spontanous Breathing  Post-op Assessment: Report given to RN and Post -op Vital signs reviewed and stable  Post vital signs: Reviewed and stable  Last Vitals:  Vitals Value Taken Time  BP 99/73 10/15/21 0943  Temp 36.9 C 10/15/21 0943  Pulse 83 10/15/21 0943  Resp 16 10/15/21 0943  SpO2 97 % 10/15/21 0943    Last Pain:  Vitals:   10/15/21 0943  TempSrc: Oral  PainSc: 0-No pain      Patients Stated Pain Goal: 10 (89/16/94 5038)  Complications: No notable events documented.

## 2021-10-15 NOTE — Anesthesia Postprocedure Evaluation (Signed)
Anesthesia Post Note  Patient: Daniel Mcpherson  Procedure(s) Performed: COLONOSCOPY WITH PROPOFOL POLYPECTOMY  Patient location during evaluation: Phase II Anesthesia Type: General Level of consciousness: awake and alert and oriented Pain management: pain level controlled Vital Signs Assessment: post-procedure vital signs reviewed and stable Respiratory status: spontaneous breathing, nonlabored ventilation and respiratory function stable Cardiovascular status: blood pressure returned to baseline and stable Postop Assessment: no apparent nausea or vomiting Anesthetic complications: no   No notable events documented.   Last Vitals:  Vitals:   10/15/21 0839 10/15/21 0943  BP: 100/88 99/73  Pulse: 89 83  Resp: 18 16  Temp: 37.1 C 36.9 C  SpO2: 98% 97%    Last Pain:  Vitals:   10/15/21 0943  TempSrc: Oral  PainSc: 0-No pain                 Ollie Delano C Rehanna Oloughlin

## 2021-10-15 NOTE — H&P (Signed)
$'@LOGO'Y$ @   Primary Care Physician:  Redmond School, MD Primary Gastroenterologist:  Dr. Gala Romney  Pre-Procedure History & Physical: HPI:  Daniel Mcpherson is a 56 y.o. male here for surveillance colonoscopy.  History colonic adenoma removed 2019; here for surveillance colonoscopy.  Bowel symptoms at this time.  Past Medical History:  Diagnosis Date   Anxiety    Colon polyps    Diverticulosis    Hyperlipidemia    Hypertension    Pancreatitis    hypertriglyceridema, single episode did not require hospitalization, [facility]. Around 2000.   Tubular adenoma     Past Surgical History:  Procedure Laterality Date   COLONOSCOPY N/A 07/04/2013   Dr. Gala Romney- multiple rectal and colonic polyos removed. colonic dierticulosis, tubular adenoma on bx.   COLONOSCOPY N/A 10/04/2013   Procedure: COLONOSCOPY;  Surgeon: Daneil Dolin, MD;  [facility];  Service: Endoscopy;  Laterality: N/A;  10:30 AM   COLONOSCOPY N/A 07/08/2016   Procedure: COLONOSCOPY;  Surgeon: Daneil Dolin, MD;  [facility];  Service: Endoscopy;  Laterality: N/A;  10:30am   FEMORAL HERNIA REPAIR     KNEE SURGERY     left   POLYPECTOMY  07/08/2016   Procedure: POLYPECTOMY;  Surgeon: Daneil Dolin, MD;  [facility]: [facility];  Service: Endoscopy;;  colon   SHOULDER SURGERY  11/2012   left    Prior to Admission medications   Medication Sig Start Date End Date Taking? Authorizing Provider  acetaminophen (TYLENOL) 500 MG tablet Take 1,000 mg by mouth every 6 (six) hours as needed for mild pain.   Yes [provider]  aspirin EC 81 MG tablet Take 81 mg by mouth daily.   Yes [provider]  cholecalciferol (VITAMIN D) 1000 UNITS tablet Take 1,000 Units by mouth daily.   Yes [provider]  escitalopram (LEXAPRO) 10 MG tablet Take 10 mg by mouth daily. 11/19/12  Yes [provider]  fenofibrate 160 MG tablet Take 160 mg by mouth daily.  05/15/13  Yes [provider]   GLUCOSAMINE-CHONDROITIN PO Take 1 tablet by mouth daily.   Yes [provider]  hydrochlorothiazide (HYDRODIURIL) 12.5 MG tablet Take 12.5 mg by mouth daily. 10/02/21  Yes [provider]  lisinopril (ZESTRIL) 10 MG tablet Take 10 mg by mouth daily. 10/02/21  Yes [provider]  Omega-3 Fatty Acids (FISH OIL) 1200 MG CAPS Take 1,200 mg by mouth 2 (two) times daily.   Yes [provider]  polyethylene glycol-electrolytes (NULYTELY) 420 g solution As directed 09/07/21  Yes Micaila Ziemba, [name], MD  Probiotic Product (PROBIOTIC PO) Take 1 capsule by mouth daily.   Yes [provider]  rosuvastatin (CRESTOR) 40 MG tablet Take 40 mg by mouth daily. 10/05/21  Yes [provider]    Allergies as of 09/07/2021   (No Known Allergies)    Family History  Problem Relation Age of Onset   Hypercholesterolemia Mother    Colon cancer Neg Hx    Pancreatitis Neg Hx     Social History   Socioeconomic History   Marital status: Single    Spouse name: Not on file   Number of children: Not on file   Years of education: Not on file   Highest education level: Not on file  Occupational History   Occupation: [employer]    Employer: [employer]   Tobacco Use   Smoking status: Every Day  Packs/day: 0.50    Types: Cigarettes    Start date: 01/07/1982   Smokeless tobacco: Never   Tobacco comments:    1/2 pack daily  Vaping Use   Vaping Use: Never used  Substance and Sexual Activity   Alcohol use: No   Drug use: No   Sexual activity: Not on file  Other Topics Concern   Not on file  Social History Narrative   Not on file   Social Determinants of Health   Financial Resource Strain: Not on file  Food Insecurity: Not on file  Transportation Needs: Not on file  Physical Activity: Not on file  Stress: Not on file  Social Connections: Not on file  Intimate Partner Violence: Not on file    Review of  Systems: See HPI, otherwise negative ROS  Physical Exam: BP 100/88   Pulse 89   Temp 98.7 F (37.1 C) (Oral)   Resp 18   Ht '5\' 6"'$  (1.676 m)   Wt 76.2 kg   SpO2 98%   BMI 27.12 kg/m  General:   Alert,  Well-developed, well-nourished, pleasant and cooperative in NAD Mouth:  No deformity or lesions. Neck:  Supple; no masses or thyromegaly. No significant cervical adenopathy. Lungs:  Clear throughout to auscultation.   No wheezes, crackles, or rhonchi. No acute distress. Heart:  Regular rate and rhythm; no murmurs, clicks, rubs,  or gallops. Abdomen: Non-distended, normal bowel sounds.  Soft and nontender without appreciable mass or hepatosplenomegaly.  Pulses:  Normal pulses noted. Extremities:  Without clubbing or edema.  Impression/Plan: 56 year old gentleman here for surveillance colonoscopy.  History colonic adenoma. The risks, benefits, limitations, alternatives and imponderables have been reviewed with the patient. Questions have been answered. All parties are agreeable.       Notice: This dictation was prepared with Dragon dictation along with smaller phrase technology. Any transcriptional errors that result from this process are unintentional and may not be corrected upon review.

## 2021-10-15 NOTE — Anesthesia Preprocedure Evaluation (Signed)
Anesthesia Evaluation  Patient identified by MRN, date of birth, ID band Patient awake    Reviewed: Allergy & Precautions, NPO status , Patient's Chart, lab work & pertinent test results  Airway Mallampati: III  TM Distance: >3 FB Neck ROM: Full    Dental  (+) Teeth Intact   Pulmonary Current Smoker and Patient abstained from smoking.,    Pulmonary exam normal breath sounds clear to auscultation       Cardiovascular Exercise Tolerance: Good hypertension, Pt. on medications Normal cardiovascular exam Rhythm:Regular Rate:Normal     Neuro/Psych PSYCHIATRIC DISORDERS Anxiety Depression negative neurological ROS     GI/Hepatic negative GI ROS, Neg liver ROS,   Endo/Other  negative endocrine ROS  Renal/GU negative Renal ROS  negative genitourinary   Musculoskeletal negative musculoskeletal ROS (+)   Abdominal   Peds negative pediatric ROS (+)  Hematology negative hematology ROS (+)   Anesthesia Other Findings Left scapula fracture Multiple fractures of ribs of left side Fractures of thoracic transverse processes    Reproductive/Obstetrics negative OB ROS                             Anesthesia Physical Anesthesia Plan  ASA: 2  Anesthesia Plan: General   Post-op Pain Management: Minimal or no pain anticipated   Induction: Intravenous  PONV Risk Score and Plan:   Airway Management Planned: Natural Airway and Nasal Cannula  Additional Equipment:   Intra-op Plan:   Post-operative Plan:   Informed Consent: I have reviewed the patients History and Physical, chart, labs and discussed the procedure including the risks, benefits and alternatives for the proposed anesthesia with the patient or authorized representative who has indicated his/her understanding and acceptance.     Dental advisory given  Plan Discussed with: CRNA and Surgeon  Anesthesia Plan Comments:          Anesthesia Quick Evaluation

## 2021-10-16 LAB — SURGICAL PATHOLOGY

## 2021-10-19 ENCOUNTER — Encounter: Payer: Self-pay | Admitting: Internal Medicine

## 2021-10-20 ENCOUNTER — Encounter (HOSPITAL_COMMUNITY): Payer: Self-pay | Admitting: Internal Medicine

## 2022-02-04 ENCOUNTER — Encounter: Payer: Self-pay | Admitting: Emergency Medicine

## 2022-02-04 ENCOUNTER — Ambulatory Visit
Admission: EM | Admit: 2022-02-04 | Discharge: 2022-02-04 | Disposition: A | Discharge status: Home / Self Care | Payer: BC Managed Care – PPO | Attending: Family Medicine | Admitting: Family Medicine

## 2022-02-04 DIAGNOSIS — U071 COVID-19: Secondary | ICD-10-CM | POA: Diagnosis not present

## 2022-02-04 DIAGNOSIS — J069 Acute upper respiratory infection, unspecified: Secondary | ICD-10-CM

## 2022-02-04 LAB — RESP PANEL BY RT-PCR (FLU A&B, COVID) ARPGX2
Influenza A by PCR: NEGATIVE
Influenza B by PCR: NEGATIVE
SARS Coronavirus 2 by RT PCR: POSITIVE — AB

## 2022-02-04 MED ORDER — FLUTICASONE PROPIONATE 50 MCG/ACT NA SUSP: 1.0000 | Freq: Two times a day (BID) | NASAL | 2 refills | Status: AC

## 2022-02-04 NOTE — ED Triage Notes (Signed)
Chills, on Monday and sneezing.  Tuesday nasal congestion.  Felt better yesterday.  Has been using over the counter decongestion medication with some relief.  Facial pain.

## 2022-02-04 NOTE — ED Provider Notes (Signed)
[facility]    CSN: [id number] Arrival date & time: 02/04/22  1432      History   Chief Complaint No chief complaint on file.   HPI Daniel Mcpherson is a 56 y.o. male.   Patient presenting today with 4-day history of sneezing, chills, sweats, nasal congestion, fatigue, mild cough.  Denies chest pain, shortness of breath, abdominal pain, nausea vomiting or diarrhea.  So far tried over-the-counter decongestants with temporary relief of symptoms.  Multiple sick contacts recently.    Past Medical History:  Diagnosis Date   Anxiety    Colon polyps    Diverticulosis    Hyperlipidemia    Hypertension    Pancreatitis    hypertriglyceridema, single episode did not require hospitalization, [facility]. Around 2000.   Tubular adenoma     Patient Active Problem List   Diagnosis Date Noted   Constipation 05/03/2019   Abdominal pain 05/03/2019   History of colonic polyps 06/18/2013   Rectal bleeding 06/18/2013   Fall 11/26/2012   Multiple fractures of ribs of left side 11/26/2012   Left scapula fracture 11/26/2012   Fractures of thoracic transverse processes 11/26/2012   HTN (hypertension) 11/26/2012   Depression 11/26/2012   Concussion with loss of consciousness 11/26/2012    Past Surgical History:  Procedure Laterality Date   COLONOSCOPY N/A 07/04/2013   Dr. Gala Romney- multiple rectal and colonic polyos removed. colonic dierticulosis, tubular adenoma on bx.   COLONOSCOPY N/A 10/04/2013   Procedure: COLONOSCOPY;  Surgeon: Daneil Dolin, MD;  [facility];  Service: Endoscopy;  Laterality: N/A;  10:30 AM   COLONOSCOPY N/A 07/08/2016   Procedure: COLONOSCOPY;  Surgeon: Daneil Dolin, MD;  [facility];  Service: Endoscopy;  Laterality: N/A;  10:30am   COLONOSCOPY WITH PROPOFOL N/A 10/15/2021   Procedure: COLONOSCOPY WITH PROPOFOL;  Surgeon: Daneil Dolin, MD;  [facility];  Service: Endoscopy;  Laterality: N/A;  9:45am   FEMORAL HERNIA  REPAIR     KNEE SURGERY     left   POLYPECTOMY  07/08/2016   Procedure: POLYPECTOMY;  Surgeon: Daneil Dolin, MD;  [facility];  Service: Endoscopy;;  colon   POLYPECTOMY  10/15/2021   Procedure: POLYPECTOMY;  Surgeon: Daneil Dolin, MD;  [facility];  Service: Endoscopy;;   SHOULDER SURGERY  11/2012   left       Home Medications    Prior to Admission medications   Medication Sig Start Date End Date Taking? Authorizing Provider  fluticasone (FLONASE) 50 MCG/ACT nasal spray Place 1 spray into both nostrils 2 (two) times daily. 02/04/22  Yes Volney American, PA-C  acetaminophen (TYLENOL) 500 MG tablet Take 1,000 mg by mouth every 6 (six) hours as needed for mild pain.    [provider]  aspirin EC 81 MG tablet Take 81 mg by mouth daily.    [provider]  cholecalciferol (VITAMIN D) 1000 UNITS tablet Take 1,000 Units by mouth daily.    [provider]  escitalopram (LEXAPRO) 10 MG tablet Take 10 mg by mouth daily. 11/19/12   [provider]  fenofibrate 160 MG tablet Take 160 mg by mouth daily.  05/15/13   [provider]  GLUCOSAMINE-CHONDROITIN PO Take 1 tablet by mouth daily.    [provider]  hydrochlorothiazide (HYDRODIURIL) 12.5 MG tablet Take 12.5 mg by mouth daily. 10/02/21   [provider]  lisinopril (ZESTRIL) 10 MG tablet Take  10 mg by mouth daily. 10/02/21   [provider]  Omega-3 Fatty Acids (FISH OIL) 1200 MG CAPS Take 1,200 mg by mouth 2 (two) times daily.    [provider]  polyethylene glycol-electrolytes (NULYTELY) 420 g solution As directed 09/07/21   Rourk, [name], MD  Probiotic Product (PROBIOTIC PO) Take 1 capsule by mouth daily.    [provider]  rosuvastatin (CRESTOR) 40 MG tablet Take 40 mg by mouth daily. 10/05/21   [provider]    Family History Family History  Problem Relation Age of Onset   Hypercholesterolemia Mother     Colon cancer Neg Hx    Pancreatitis Neg Hx     Social History Social History   Tobacco Use   Smoking status: Every Day    Packs/day: 0.50    Types: Cigarettes    Start date: 01/07/1982   Smokeless tobacco: Never   Tobacco comments:    1/2 pack daily  Vaping Use   Vaping Use: Never used  Substance Use Topics   Alcohol use: No   Drug use: No     Allergies   Patient has no known allergies.   Review of Systems Review of Systems PER HPI  Physical Exam Triage Vital Signs ED Triage Vitals  Enc Vitals Group     BP 02/04/22 1456 (!) 128/90     Pulse Rate 02/04/22 1456 96     Resp 02/04/22 1456 18     Temp 02/04/22 1456 98.9 F (37.2 C)     Temp Source 02/04/22 1456 Oral     SpO2 02/04/22 1456 98 %     Weight --      Height --      Head Circumference --      Peak Flow --      Pain Score 02/04/22 1457 5     Pain Loc --      Pain Edu? --      Excl. in [place]? --    No data found.  Updated Vital Signs BP (!) 128/90 (BP Location: Right Arm)   Pulse 96   Temp 98.9 F (37.2 C) (Oral)   Resp 18   SpO2 98%   Visual Acuity Right Eye Distance:   Left Eye Distance:   Bilateral Distance:    Right Eye Near:   Left Eye Near:    Bilateral Near:     Physical Exam Vitals and nursing note reviewed.  Constitutional:      Appearance: He is well-developed.  HENT:     Head: Atraumatic.     Right Ear: External ear normal.     Left Ear: External ear normal.     Nose: Rhinorrhea present.     Mouth/Throat:     Pharynx: Posterior oropharyngeal erythema present. No oropharyngeal exudate.  Eyes:     Conjunctiva/sclera: Conjunctivae normal.     Pupils: Pupils are equal, round, and reactive to light.  Cardiovascular:     Rate and Rhythm: Normal rate and regular rhythm.  Pulmonary:     Effort: Pulmonary effort is normal. No respiratory distress.     Breath sounds: No wheezing or rales.  Musculoskeletal:        General: Normal range of motion.     Cervical back: Normal  range of motion and neck supple.  Lymphadenopathy:     Cervical: No cervical adenopathy.  Skin:    General: Skin is warm and dry.  Neurological:     Mental Status:  He is alert and oriented to person, place, and time.  Psychiatric:        Behavior: Behavior normal.      UC Treatments / Results  Labs (all labs ordered are listed, but only abnormal results are displayed) Labs Reviewed  RESP PANEL BY RT-PCR (FLU A&B, COVID) [id number]    EKG   Radiology No results found.  Procedures Procedures (including critical care time)  Medications Ordered in UC Medications - No data to display  Initial Impression / Assessment and Plan / UC Course  I have reviewed the triage vital signs and the nursing notes.  Pertinent labs & imaging results that were available during my care of the patient were reviewed by me and considered in my medical decision making (see chart for details).     Vitals and exam reassuring and suggestive of a viral upper respiratory infection.  Respiratory panel pending, treat with Flonase, over-the-counter cold and congestion medications and supportive home care.  Return for worsening symptoms.  Final Clinical Impressions(s) / UC Diagnoses   Final diagnoses:  Viral URI with cough   Discharge Instructions   None    ED Prescriptions     Medication Sig Dispense Auth. Provider   fluticasone (FLONASE) 50 MCG/ACT nasal spray Place 1 spray into both nostrils 2 (two) times daily. 16 g Volney American, [place]      PDMP not reviewed this encounter.   Benton, [place], [place] 02/04/22 1531

## 2022-02-15 DIAGNOSIS — Z6827 Body mass index (BMI) 27.0-27.9, adult: Secondary | ICD-10-CM | POA: Diagnosis not present

## 2022-02-15 DIAGNOSIS — J019 Acute sinusitis, unspecified: Secondary | ICD-10-CM | POA: Diagnosis not present

## 2022-02-15 DIAGNOSIS — U099 Post covid-19 condition, unspecified: Secondary | ICD-10-CM | POA: Diagnosis not present

## 2022-02-15 DIAGNOSIS — E663 Overweight: Secondary | ICD-10-CM | POA: Diagnosis not present

## 2022-02-15 DIAGNOSIS — B9689 Other specified bacterial agents as the cause of diseases classified elsewhere: Secondary | ICD-10-CM | POA: Diagnosis not present

## 2022-04-30 ENCOUNTER — Other Ambulatory Visit: Payer: Self-pay | Admitting: Family Medicine

## 2022-05-03 NOTE — Telephone Encounter (Signed)
Prescribed at [facility]- no longer under prescribers care

## 2022-08-27 DIAGNOSIS — R3121 Asymptomatic microscopic hematuria: Secondary | ICD-10-CM | POA: Diagnosis not present

## 2022-08-27 DIAGNOSIS — R972 Elevated prostate specific antigen [PSA]: Secondary | ICD-10-CM | POA: Diagnosis not present

## 2022-08-27 DIAGNOSIS — N400 Enlarged prostate without lower urinary tract symptoms: Secondary | ICD-10-CM | POA: Diagnosis not present

## 2022-10-19 DIAGNOSIS — K635 Polyp of colon: Secondary | ICD-10-CM | POA: Diagnosis not present

## 2022-10-19 DIAGNOSIS — Z6828 Body mass index (BMI) 28.0-28.9, adult: Secondary | ICD-10-CM | POA: Diagnosis not present

## 2022-10-19 DIAGNOSIS — Z1331 Encounter for screening for depression: Secondary | ICD-10-CM | POA: Diagnosis not present

## 2022-10-19 DIAGNOSIS — R972 Elevated prostate specific antigen [PSA]: Secondary | ICD-10-CM | POA: Diagnosis not present

## 2022-10-19 DIAGNOSIS — E782 Mixed hyperlipidemia: Secondary | ICD-10-CM | POA: Diagnosis not present

## 2022-10-19 DIAGNOSIS — Z0000 Encounter for general adult medical examination without abnormal findings: Secondary | ICD-10-CM | POA: Diagnosis not present

## 2022-10-19 DIAGNOSIS — E663 Overweight: Secondary | ICD-10-CM | POA: Diagnosis not present

## 2022-10-19 DIAGNOSIS — E7849 Other hyperlipidemia: Secondary | ICD-10-CM | POA: Diagnosis not present

## 2022-10-19 DIAGNOSIS — I10 Essential (primary) hypertension: Secondary | ICD-10-CM | POA: Diagnosis not present

## 2022-10-19 DIAGNOSIS — E119 Type 2 diabetes mellitus without complications: Secondary | ICD-10-CM | POA: Diagnosis not present

## 2022-11-12 DIAGNOSIS — R972 Elevated prostate specific antigen [PSA]: Secondary | ICD-10-CM | POA: Diagnosis not present

## 2022-11-12 DIAGNOSIS — R3121 Asymptomatic microscopic hematuria: Secondary | ICD-10-CM | POA: Diagnosis not present

## 2022-12-31 DIAGNOSIS — N401 Enlarged prostate with lower urinary tract symptoms: Secondary | ICD-10-CM | POA: Diagnosis not present

## 2022-12-31 DIAGNOSIS — R350 Frequency of micturition: Secondary | ICD-10-CM | POA: Diagnosis not present

## 2022-12-31 DIAGNOSIS — R3121 Asymptomatic microscopic hematuria: Secondary | ICD-10-CM | POA: Diagnosis not present

## 2022-12-31 DIAGNOSIS — R972 Elevated prostate specific antigen [PSA]: Secondary | ICD-10-CM | POA: Diagnosis not present

## 2023-07-01 DIAGNOSIS — R350 Frequency of micturition: Secondary | ICD-10-CM | POA: Diagnosis not present

## 2023-07-01 DIAGNOSIS — N401 Enlarged prostate with lower urinary tract symptoms: Secondary | ICD-10-CM | POA: Diagnosis not present

## 2023-07-01 DIAGNOSIS — R972 Elevated prostate specific antigen [PSA]: Secondary | ICD-10-CM | POA: Diagnosis not present

## 2023-07-01 DIAGNOSIS — Z87442 Personal history of urinary calculi: Secondary | ICD-10-CM | POA: Diagnosis not present

## 2023-07-06 ENCOUNTER — Other Ambulatory Visit: Payer: Self-pay | Admitting: Urology

## 2023-07-06 DIAGNOSIS — R972 Elevated prostate specific antigen [PSA]: Secondary | ICD-10-CM

## 2023-07-07 ENCOUNTER — Other Ambulatory Visit: Payer: Self-pay | Admitting: Urology

## 2023-08-19 ENCOUNTER — Encounter: Payer: Self-pay | Admitting: Urology

## 2023-08-23 ENCOUNTER — Ambulatory Visit
Admission: RE | Admit: 2023-08-23 | Discharge: 2023-08-23 | Disposition: A | Discharge status: Home / Self Care | Source: Ambulatory Visit | Attending: Urology | Admitting: Urology

## 2023-08-23 DIAGNOSIS — N4289 Other specified disorders of prostate: Secondary | ICD-10-CM | POA: Diagnosis not present

## 2023-08-23 DIAGNOSIS — R972 Elevated prostate specific antigen [PSA]: Secondary | ICD-10-CM

## 2023-08-23 MED ORDER — GADOPICLENOL 0.5 MMOL/ML IV SOLN
8.0000 mL | Freq: Once | INTRAVENOUS | Status: AC | PRN
Start: 2023-08-23 — End: 2023-08-23
  Administered 2023-08-23: 8 mL via INTRAVENOUS

## 2023-08-29 DIAGNOSIS — R350 Frequency of micturition: Secondary | ICD-10-CM | POA: Diagnosis not present

## 2023-08-29 DIAGNOSIS — N401 Enlarged prostate with lower urinary tract symptoms: Secondary | ICD-10-CM | POA: Diagnosis not present

## 2023-08-29 DIAGNOSIS — R972 Elevated prostate specific antigen [PSA]: Secondary | ICD-10-CM | POA: Diagnosis not present

## 2023-08-29 DIAGNOSIS — Z87442 Personal history of urinary calculi: Secondary | ICD-10-CM | POA: Diagnosis not present

## 2023-10-25 DIAGNOSIS — Z6827 Body mass index (BMI) 27.0-27.9, adult: Secondary | ICD-10-CM | POA: Diagnosis not present

## 2023-10-25 DIAGNOSIS — F329 Major depressive disorder, single episode, unspecified: Secondary | ICD-10-CM | POA: Diagnosis not present

## 2023-10-25 DIAGNOSIS — E119 Type 2 diabetes mellitus without complications: Secondary | ICD-10-CM | POA: Diagnosis not present

## 2023-11-03 DIAGNOSIS — I10 Essential (primary) hypertension: Secondary | ICD-10-CM | POA: Diagnosis not present

## 2023-11-03 DIAGNOSIS — F418 Other specified anxiety disorders: Secondary | ICD-10-CM | POA: Diagnosis not present

## 2023-11-03 DIAGNOSIS — E785 Hyperlipidemia, unspecified: Secondary | ICD-10-CM | POA: Diagnosis not present

## 2023-11-03 DIAGNOSIS — N400 Enlarged prostate without lower urinary tract symptoms: Secondary | ICD-10-CM | POA: Diagnosis not present

## 2023-11-18 DIAGNOSIS — I10 Essential (primary) hypertension: Secondary | ICD-10-CM | POA: Diagnosis not present

## 2023-11-18 DIAGNOSIS — F418 Other specified anxiety disorders: Secondary | ICD-10-CM | POA: Diagnosis not present

## 2023-11-18 DIAGNOSIS — Z131 Encounter for screening for diabetes mellitus: Secondary | ICD-10-CM | POA: Diagnosis not present

## 2023-11-24 DIAGNOSIS — N400 Enlarged prostate without lower urinary tract symptoms: Secondary | ICD-10-CM | POA: Diagnosis not present

## 2023-11-24 DIAGNOSIS — E785 Hyperlipidemia, unspecified: Secondary | ICD-10-CM | POA: Diagnosis not present

## 2023-11-24 DIAGNOSIS — F418 Other specified anxiety disorders: Secondary | ICD-10-CM | POA: Diagnosis not present

## 2023-11-24 DIAGNOSIS — I10 Essential (primary) hypertension: Secondary | ICD-10-CM | POA: Diagnosis not present

## 2023-11-25 ENCOUNTER — Other Ambulatory Visit (HOSPITAL_COMMUNITY): Payer: Self-pay | Admitting: Family Medicine

## 2023-11-25 DIAGNOSIS — Z720 Tobacco use: Secondary | ICD-10-CM

## 2023-12-05 DIAGNOSIS — R972 Elevated prostate specific antigen [PSA]: Secondary | ICD-10-CM | POA: Diagnosis not present

## 2023-12-12 DIAGNOSIS — R972 Elevated prostate specific antigen [PSA]: Secondary | ICD-10-CM | POA: Diagnosis not present

## 2023-12-12 DIAGNOSIS — R399 Unspecified symptoms and signs involving the genitourinary system: Secondary | ICD-10-CM | POA: Diagnosis not present

## 2023-12-12 DIAGNOSIS — N401 Enlarged prostate with lower urinary tract symptoms: Secondary | ICD-10-CM | POA: Diagnosis not present

## 2023-12-30 DIAGNOSIS — R310 Gross hematuria: Secondary | ICD-10-CM | POA: Diagnosis not present

## 2024-01-19 DIAGNOSIS — R972 Elevated prostate specific antigen [PSA]: Secondary | ICD-10-CM | POA: Diagnosis not present

## 2024-01-30 DIAGNOSIS — R972 Elevated prostate specific antigen [PSA]: Secondary | ICD-10-CM | POA: Diagnosis not present

## 2024-01-30 DIAGNOSIS — N401 Enlarged prostate with lower urinary tract symptoms: Secondary | ICD-10-CM | POA: Diagnosis not present
